# Patient Record
Sex: Female | Born: 1985 | Race: Black or African American | Hispanic: No | Marital: Single | Smoking: Never smoker
Health system: Southern US, Community
[De-identification: ages and names within clinical notes are randomized; demographics above are authoritative.]

## PROBLEM LIST (undated history)

## (undated) ENCOUNTER — Inpatient Hospital Stay (HOSPITAL_COMMUNITY): Payer: Self-pay

## (undated) DIAGNOSIS — O139 Gestational [pregnancy-induced] hypertension without significant proteinuria, unspecified trimester: Secondary | ICD-10-CM

## (undated) DIAGNOSIS — B999 Unspecified infectious disease: Secondary | ICD-10-CM

---
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
## 2005-06-03 DIAGNOSIS — O139 Gestational [pregnancy-induced] hypertension without significant proteinuria, unspecified trimester: Secondary | ICD-10-CM

## 2005-06-03 HISTORY — DX: Gestational (pregnancy-induced) hypertension without significant proteinuria, unspecified trimester: O13.9

## 2008-09-13 ENCOUNTER — Emergency Department (HOSPITAL_COMMUNITY): Admission: EM | Admit: 2008-09-13 | Discharge: 2008-09-13 | Payer: Self-pay | Admitting: Family Medicine

## 2012-03-18 ENCOUNTER — Encounter (HOSPITAL_COMMUNITY): Payer: Self-pay | Admitting: Radiology

## 2012-03-18 ENCOUNTER — Inpatient Hospital Stay (HOSPITAL_COMMUNITY): Payer: Medicaid Other

## 2012-03-18 ENCOUNTER — Inpatient Hospital Stay (HOSPITAL_COMMUNITY)
Admission: AD | Admit: 2012-03-18 | Discharge: 2012-03-18 | Disposition: A | Discharge status: Home / Self Care | Payer: Medicaid Other | Source: Ambulatory Visit | Attending: Obstetrics & Gynecology | Admitting: Obstetrics & Gynecology

## 2012-03-18 DIAGNOSIS — R109 Unspecified abdominal pain: Secondary | ICD-10-CM | POA: Insufficient documentation

## 2012-03-18 DIAGNOSIS — O99891 Other specified diseases and conditions complicating pregnancy: Secondary | ICD-10-CM | POA: Insufficient documentation

## 2012-03-18 HISTORY — DX: Unspecified infectious disease: B99.9

## 2012-03-18 HISTORY — DX: Gestational (pregnancy-induced) hypertension without significant proteinuria, unspecified trimester: O13.9

## 2012-03-18 LAB — WET PREP, GENITAL: Clue Cells Wet Prep HPF POC: NONE SEEN

## 2012-03-18 LAB — URINALYSIS, ROUTINE W REFLEX MICROSCOPIC
Ketones, ur: 15 mg/dL — AB
Leukocytes, UA: NEGATIVE
Nitrite: NEGATIVE
Protein, ur: NEGATIVE mg/dL
Urobilinogen, UA: 2 mg/dL — ABNORMAL HIGH (ref 0.0–1.0)
pH: 6.5 (ref 5.0–8.0)

## 2012-03-18 LAB — CBC
MCH: 29.1 pg (ref 26.0–34.0)
MCHC: 35.8 g/dL (ref 30.0–36.0)
MCV: 81.4 fL (ref 78.0–100.0)
Platelets: 307 10*3/uL (ref 150–400)

## 2012-03-18 LAB — ABO/RH: ABO/RH(D): A POS

## 2012-03-18 NOTE — MAU Provider Note (Signed)
History     CSN: [id number]  Arrival date and time: 03/18/12 1215   First Provider Initiated Contact with Patient 03/18/12 1523      Chief Complaint  Patient presents with  . Possible Pregnancy  . Abdominal Cramping   HPI Paula Livingston is 26 y.o. [id number] Unknown weeks presenting with cramping that began 2 weeks ago.  LMP ? Hx of irregular cycles.  Sexually active X 1 partner.  Using nothing for contraception. She thought with her cycles being irregular and the fact that she has not used contraception in 5 years with conceiving that she couldn't get pregnant.   Denies vaginal bleeding, nausea and vomiting.  Not sure where she will get prenatal care.  Just now learning of pregnancy.     Past Medical History  Diagnosis Date  . Pregnancy induced hypertension   . Infection     gonorrhea    Past Surgical History  Procedure Date  . Cesarean section     Family History  Problem Relation Age of Onset  . Other Neg Hx     History  Substance Use Topics  . Smoking status: Never Smoker   . Smokeless tobacco: Never Used  . Alcohol Use: No    Allergies: No Known Allergies  Prescriptions prior to admission  Medication Sig Dispense Refill  . ibuprofen (ADVIL,MOTRIN) 200 MG tablet Take 400 mg by mouth every 6 (six) hours as needed. For pain        Review of Systems  Constitutional: Negative.   HENT: Negative.   Respiratory: Negative.   Cardiovascular: Negative.   Gastrointestinal: Positive for abdominal pain (cramping-comes and goes). Negative for nausea and vomiting.  Genitourinary:       Neg for discharge or bleeding   Physical Exam   Blood pressure 113/66, pulse 76, temperature 98.9 F (37.2 C), temperature source Oral, resp. rate 18, height 5\' 1"  (1.549 m), weight 75.479 kg (166 lb 6.4 oz), SpO2 100.00%.  Physical Exam  Constitutional: She is oriented to person, place, and time. She appears well-developed and well-nourished. No distress.  HENT:  Head:  Normocephalic.  Neck: Normal range of motion.  Cardiovascular: Normal rate.   Respiratory: Effort normal.  GI: Soft. She exhibits no distension and no mass. There is no tenderness. There is no rebound and no guarding.  Genitourinary: There is no tenderness on the right labia. There is no tenderness on the left labia. Uterus is not enlarged and not tender. Right adnexum displays no mass, no tenderness and no fullness. Left adnexum displays no mass, no tenderness and no fullness. No erythema or bleeding around the vagina. Vaginal discharge (small amount of white discharge without odor) found.  Neurological: She is alert and oriented to person, place, and time.  Skin: Skin is warm and dry.  Psychiatric: She has a normal mood and affect. Her behavior is normal.    Results for orders placed during the hospital encounter of 03/18/12 (from the past 24 hour(s))  URINALYSIS, ROUTINE W REFLEX MICROSCOPIC     Status: Abnormal   Collection Time   03/18/12 12:35 PM      Component Value Range   Color, Urine YELLOW  YELLOW   APPearance CLEAR  CLEAR   Specific Gravity, Urine 1.020  1.005 - 1.030   pH 6.5  5.0 - 8.0   Glucose, UA NEGATIVE  NEGATIVE mg/dL   Hgb urine dipstick NEGATIVE  NEGATIVE   Bilirubin Urine NEGATIVE  NEGATIVE   Ketones, ur 15 (*) NEGATIVE  mg/dL   Protein, ur NEGATIVE  NEGATIVE mg/dL   Urobilinogen, UA 2.0 (*) 0.0 - 1.0 mg/dL   Nitrite NEGATIVE  NEGATIVE   Leukocytes, UA NEGATIVE  NEGATIVE  POCT PREGNANCY, URINE     Status: Abnormal   Collection Time   03/18/12 12:43 PM      Component Value Range   Preg Test, Ur POSITIVE (*) NEGATIVE  CBC     Status: Normal   Collection Time   03/18/12  2:10 PM      Component Value Range   WBC 8.7  4.0 - 10.5 K/uL   RBC 4.57  3.87 - 5.11 MIL/uL   Hemoglobin 13.3  12.0 - 15.0 g/dL   HCT 40.9  81.1 - 91.4 %   MCV 81.4  78.0 - 100.0 fL   MCH 29.1  26.0 - 34.0 pg   MCHC 35.8  30.0 - 36.0 g/dL   RDW 78.2  95.6 - 21.3 %   Platelets 307  150  - 400 K/uL  ABO/RH     Status: Normal (Preliminary result)   Collection Time   03/18/12  2:10 PM      Component Value Range   ABO/RH(D) A POS    HCG, QUANTITATIVE, PREGNANCY     Status: Abnormal   Collection Time   03/18/12  2:10 PM      Component Value Range   hCG, Beta Chain, Quant, S 2764 (*) <5 mIU/mL  WET PREP, GENITAL     Status: Abnormal   Collection Time   03/18/12  3:50 PM      Component Value Range   Yeast Wet Prep HPF POC NONE SEEN  NONE SEEN   Trich, Wet Prep NONE SEEN  NONE SEEN   Clue Cells Wet Prep HPF POC NONE SEEN  NONE SEEN   WBC, Wet Prep HPF POC FEW (*) NONE SEEN    MAU Course  Procedures  GC/CHL culture to lab  MDM  Assessment and Plan  A;  Very early IUGS measuring [redacted]w[redacted]d.     Cramping in early pregnancy       P  Repeat BHCG 48 hrs      Return for increasing pain or heavy vaginal bleeding.  Paula Livingston,EVE M 03/18/2012, 4:09 PM

## 2012-03-18 NOTE — MAU Note (Signed)
Patient state she has taken 2 home pregnancy tests earlier this week, one positive, one negative. Has been having abdominal cramping for about 2 weeks. Denies bleeding or discharge.

## 2012-03-18 NOTE — MAU Note (Signed)
Lower abd cramping started a couple wks ago. Pain comes and goes.

## 2012-03-20 ENCOUNTER — Encounter (HOSPITAL_COMMUNITY): Payer: Self-pay | Admitting: *Deleted

## 2012-03-20 ENCOUNTER — Inpatient Hospital Stay (HOSPITAL_COMMUNITY)
Admission: AD | Admit: 2012-03-20 | Discharge: 2012-03-20 | Disposition: A | Discharge status: Home / Self Care | Payer: Medicaid Other | Source: Ambulatory Visit | Attending: Obstetrics & Gynecology | Admitting: Obstetrics & Gynecology

## 2012-03-20 DIAGNOSIS — O3680X0 Pregnancy with inconclusive fetal viability, not applicable or unspecified: Secondary | ICD-10-CM

## 2012-03-20 DIAGNOSIS — R109 Unspecified abdominal pain: Secondary | ICD-10-CM | POA: Insufficient documentation

## 2012-03-20 DIAGNOSIS — O26899 Other specified pregnancy related conditions, unspecified trimester: Secondary | ICD-10-CM

## 2012-03-20 DIAGNOSIS — O99891 Other specified diseases and conditions complicating pregnancy: Secondary | ICD-10-CM | POA: Insufficient documentation

## 2012-03-20 LAB — HCG, QUANTITATIVE, PREGNANCY: hCG, Beta Chain, Quant, S: 4221 m[IU]/mL — ABNORMAL HIGH (ref <5)

## 2012-03-20 NOTE — MAU Provider Note (Signed)
Attestation of Attending Supervision of Advanced Practitioner (CNM/NP): Evaluation and management procedures were performed by the Advanced Practitioner under my supervision and collaboration.  I have reviewed the Advanced Practitioner's note and chart, and I agree with the management and plan.  HARRAWAY-SMITH, Shemar Plemmons 12:51 PM     

## 2012-03-20 NOTE — MAU Provider Note (Signed)
  History     CSN: [id number]  Arrival date and time: 03/20/12 1454   None     No chief complaint on file.  HPI Pt is [redacted]w[redacted]d pregnant here for follow up BetaHCG.  Pt was initially seen on 10/16 with cramping.  Her HCG at that time was 2764 with ultrasound showing a gestational sac measuring [redacted]w[redacted]d but no YS. Pt had some cramping earlier, but none now and no bleeding.  Past Medical History  Diagnosis Date  . Pregnancy induced hypertension   . Infection     gonorrhea    Past Surgical History  Procedure Date  . Cesarean section     Family History  Problem Relation Age of Onset  . Other Neg Hx     History  Substance Use Topics  . Smoking status: Never Smoker   . Smokeless tobacco: Never Used  . Alcohol Use: No    Allergies: No Known Allergies  No prescriptions prior to admission    ROS Physical Exam   There were no vitals taken for this visit.  Physical Exam  MAU Course  Procedures Results for orders placed during the hospital encounter of 03/20/12 (from the past 72 hour(s))  HCG, QUANTITATIVE, PREGNANCY     Status: Abnormal   Collection Time   03/20/12  3:00 PM      Component Value Range Comment   hCG, Beta Chain, Quant, S 4221 (*) <5 mIU/mL    Discussed with Dr. Erin Fulling- will have pt return for ultrasound one week from prior ultrasound- order placed in Epic for radiology to schedule appointment and contact pt. Assessment and Plan  Abdominal pain in early pregnancy Repeat ultrasound 10/23 to confirm viability  Deirdre Gryder 03/20/2012, 3:03 PM

## 2012-03-20 NOTE — MAU Note (Signed)
Back today as instructed for repeat blood work.  Had some cramping earlier today, none now.  No bleeding.  No complaints, doing ok.

## 2012-03-23 NOTE — MAU Provider Note (Signed)
Attestation of Attending Supervision of Advanced Practitioner (CNM/NP): Evaluation and management procedures were performed by the Advanced Practitioner under my supervision and collaboration.  I have reviewed the Advanced Practitioner's note and chart, and I agree with the management and plan.  HARRAWAY-SMITH, Teyana Pierron 2:00 PM     

## 2012-03-25 ENCOUNTER — Encounter (HOSPITAL_COMMUNITY): Payer: Self-pay

## 2012-03-25 ENCOUNTER — Ambulatory Visit (HOSPITAL_COMMUNITY)
Admission: RE | Admit: 2012-03-25 | Discharge: 2012-03-25 | Disposition: A | Discharge status: Home / Self Care | Payer: Medicaid Other | Source: Ambulatory Visit | Attending: Gynecology | Admitting: Gynecology

## 2012-03-25 ENCOUNTER — Other Ambulatory Visit (HOSPITAL_COMMUNITY): Payer: Self-pay | Admitting: Gynecology

## 2012-03-25 ENCOUNTER — Inpatient Hospital Stay (HOSPITAL_COMMUNITY)
Admission: AD | Admit: 2012-03-25 | Discharge: 2012-03-25 | Disposition: A | Discharge status: Home / Self Care | Payer: Self-pay | Source: Ambulatory Visit | Attending: Obstetrics & Gynecology | Admitting: Obstetrics & Gynecology

## 2012-03-25 DIAGNOSIS — R109 Unspecified abdominal pain: Secondary | ICD-10-CM

## 2012-03-25 DIAGNOSIS — Z1389 Encounter for screening for other disorder: Secondary | ICD-10-CM

## 2012-03-25 DIAGNOSIS — O99891 Other specified diseases and conditions complicating pregnancy: Secondary | ICD-10-CM | POA: Insufficient documentation

## 2012-03-25 DIAGNOSIS — O3680X0 Pregnancy with inconclusive fetal viability, not applicable or unspecified: Secondary | ICD-10-CM | POA: Insufficient documentation

## 2012-03-25 DIAGNOSIS — O34219 Maternal care for unspecified type scar from previous cesarean delivery: Secondary | ICD-10-CM | POA: Insufficient documentation

## 2012-03-25 DIAGNOSIS — O26899 Other specified pregnancy related conditions, unspecified trimester: Secondary | ICD-10-CM

## 2012-03-25 DIAGNOSIS — Z3490 Encounter for supervision of normal pregnancy, unspecified, unspecified trimester: Secondary | ICD-10-CM

## 2012-03-25 NOTE — MAU Note (Signed)
Pt states here for f/u u/s for viability, denies pain or bleeding.

## 2012-03-25 NOTE — MAU Provider Note (Signed)
  History     CSN: [id number]  Arrival date and time: 03/25/12 1051   First Provider Initiated Contact with Patient 03/25/12 1126      Chief Complaint  Patient presents with  . Follow-up u/s for viability    HPI Paula Livingston 26 y.o. [redacted]w[redacted]d  Here today for follow up ultrasound.  No pain.  No bleeding.  OB History    Grav Para Term Preterm Abortions TAB SAB Ect Mult Living   3 2 1 1  0 0 0 0 0 2      Past Medical History  Diagnosis Date  . Pregnancy induced hypertension   . Infection     gonorrhea    Past Surgical History  Procedure Date  . Cesarean section     Family History  Problem Relation Age of Onset  . Other Neg Hx     History  Substance Use Topics  . Smoking status: Never Smoker   . Smokeless tobacco: Never Used  . Alcohol Use: No    Allergies: No Known Allergies  No prescriptions prior to admission    Review of Systems  Gastrointestinal: Negative for abdominal pain.  Genitourinary:       No vaginal discharge. No vaginal bleeding. No dysuria.   Physical Exam   Blood pressure 121/61, pulse 86, temperature 97.9 F (36.6 C), temperature source Oral, resp. rate 16, height 5\' 1"  (1.549 m), weight 75.751 kg (167 lb).  Physical Exam  Nursing note and vitals reviewed. Constitutional: She is oriented to person, place, and time. She appears well-developed and well-nourished.  HENT:  Head: Normocephalic.  Eyes: EOM are normal.  Neck: Neck supple.  Musculoskeletal: Normal range of motion.  Neurological: She is alert and oriented to person, place, and time.  Skin: Skin is warm and dry.  Psychiatric: She has a normal mood and affect.    MAU Course  Procedures  MDM Ultrasound results - [redacted]w[redacted]d viable IUP  Assessment and Plan  Viable IUP  Plan Begin prenatal care ASAP List of providers given Verification of pregnancy given.  Jaisen Wiltrout 03/25/2012, 11:44 AM

## 2012-04-03 NOTE — MAU Provider Note (Signed)
Medical Screening exam and patient care preformed by advanced practice provider.  Agree with the above management.  

## 2012-06-03 NOTE — L&D Delivery Note (Signed)
Delivery Note  RN called me to bedside at 1711 for urge to push, SROM w clear fluid at 1715, epidural had just been completed and pt was beginning to get some relief, Dr Rodman Pickle remained at [facility], vag exam cervix complete and vtx +3, FHR overall reassuring but variable decels noted, prepped for delivery and pt pushed well over 1 ctx,    At 5:18 PM a viable female was delivered via VBAC, Spontaneous (Presentation: ; Occiput Anterior).  Shoulders delivered easily, cord tight around R ankle, APGAR: 9, 9; weight: pending  Placenta status: spontaneous, intact, sent to pathology .  Cord: 3 vessels with the following complications: None.  Cord pH: n/a  Anesthesia: Epidural, local  Episiotomy: None Lacerations: Periurethral and L labial, repaired for hemostasis, red robinson placed for visualization, UA sent to check protein Suture Repair: 4-0 monocryl Est. Blood Loss (mL): 300  Mild uterine atony noted, improved w bimanual massage, cytotec given rectally BP's had improved, Platelets 171, rest of PIH labs pending C/w Dr Normand Sloop, will place foley and collect 24hr urine, repeat PIH labs in the AM CTO for s/s pre-eclampsia    Mom to postpartum.  Baby to nursery-stable. Infant remains skin-skin w pt Mother and infant stable in recovery room Routine PP orders RN to place foley and begin 24hr urine collection to be sent stat for protein Will repeat CBC, CMET, LDH, uric acid in the AM   Rameses Ou M 10/27/2012, 6:18 PM

## 2012-06-05 ENCOUNTER — Ambulatory Visit (INDEPENDENT_AMBULATORY_CARE_PROVIDER_SITE_OTHER): Payer: Medicaid Other | Admitting: Obstetrics and Gynecology

## 2012-06-05 DIAGNOSIS — Z331 Pregnant state, incidental: Secondary | ICD-10-CM

## 2012-06-05 LAB — POCT URINALYSIS DIPSTICK
Blood, UA: NEGATIVE
Ketones, UA: NEGATIVE
Spec Grav, UA: 1.02
Urobilinogen, UA: NEGATIVE
pH, UA: 6

## 2012-06-05 NOTE — Progress Notes (Signed)
NOB interview. Records requested from [facility] for previous C/S. PT will consider QUAD screen and decide by NV.

## 2012-06-07 LAB — CULTURE, OB URINE: Colony Count: 9000

## 2012-06-08 LAB — PRENATAL PANEL VII
Basophils Relative: 0 % (ref 0–1)
Eosinophils Absolute: 0.1 10*3/uL (ref 0.0–0.7)
HIV: NONREACTIVE
Hemoglobin: 12.7 g/dL (ref 12.0–15.0)
Hepatitis B Surface Ag: NEGATIVE
Lymphs Abs: 2.1 10*3/uL (ref 0.7–4.0)
MCHC: 35.7 g/dL (ref 30.0–36.0)
Monocytes Relative: 8 % (ref 3–12)
Neutro Abs: 4.4 10*3/uL (ref 1.7–7.7)
Neutrophils Relative %: 62 % (ref 43–77)
Platelets: 244 10*3/uL (ref 150–400)
RBC: 4.21 MIL/uL (ref 3.87–5.11)

## 2012-06-09 LAB — HEMOGLOBINOPATHY EVALUATION
Hemoglobin Other: 0 %
Hgb A2 Quant: 2.6 % (ref 2.2–3.2)
Hgb A: 97.4 % (ref 96.8–97.8)
Hgb S Quant: 0 %

## 2012-06-16 ENCOUNTER — Ambulatory Visit: Payer: Medicaid Other | Admitting: *Deleted

## 2012-06-16 ENCOUNTER — Ambulatory Visit: Payer: Medicaid Other

## 2012-06-16 VITALS — BP 100/56 | Wt 164.0 lb

## 2012-06-16 DIAGNOSIS — Z331 Pregnant state, incidental: Secondary | ICD-10-CM

## 2012-06-16 DIAGNOSIS — O09299 Supervision of pregnancy with other poor reproductive or obstetric history, unspecified trimester: Secondary | ICD-10-CM | POA: Insufficient documentation

## 2012-06-16 DIAGNOSIS — O34219 Maternal care for unspecified type scar from previous cesarean delivery: Secondary | ICD-10-CM | POA: Insufficient documentation

## 2012-06-16 LAB — POCT URINALYSIS DIPSTICK
Bilirubin, UA: NEGATIVE
Blood, UA: NEGATIVE
Spec Grav, UA: 1.015
pH, UA: 7

## 2012-06-16 LAB — US OB COMP + 14 WK

## 2012-06-16 NOTE — Progress Notes (Unsigned)
Subjective:    Paula Livingston is being seen today for her first obstetrical visit at [redacted]w[redacted]d gestation by [place]. She is 27 y.o. African American, relationship w FOB Married, works full-time temp at [employer]. FOB is in the [place] away from the home presently. Discussed maternity care coordination. Desires call from Care Coordinator.  She denies any vag bleeding, cramping, or discharge.  She denies nausea/vomiting.   Her obstetrical history is significant for: Patient Active Problem List  Diagnosis  . Previous cesarean delivery, delivered, with or without mention of antepartum condition  . Hx of pre-eclampsia in prior pregnancy, currently pregnant  . H/O preterm delivery, currently pregnant  . Desires VBAC (vaginal birth after cesarean) trial      Pregnancy history fully reviewed.  The following portions of the patient's history were reviewed and updated as appropriate: allergies, current medications, past family history, past medical history, past social history, past surgical history and problem list.  Review of Systems Pertinent ROS is described in HPI   Objective:   BP 100/56  Wt 164 lb (74.39 kg) Wt Readings from Last 1 Encounters:  06/16/12 164 lb (74.39 kg)   BMI: 29.3  General: alert, cooperative and no distress HEENT: grossly normal  Thyroid: normal  Respiratory: clear to auscultation bilaterally Cardiovascular: regular rate and rhythm,  Breasts:  No dominant masses, nipples erect Gastrointestinal: soft, non-tender; no masses,  no organomegaly Extremities: extremities normal, no pain or edema   EXTERNAL GENITALIA: normal appearing vulva with no masses, tenderness or lesions VAGINA: no abnormal discharge, bleeding or lesions CERVIX: no lesions or cervical motion tenderness; cervix closed, long, firm UTERUS: gravid and consistent with 17 weeks ADNEXA: deferred until next visit - wnl per [place] today OB EXAM PELVIMETRY: deferred until next visit  FHR:  155   bpm    Assessment:    Pregnancy at  17 weeks with history of preterm delivery @ 32 weeks for pre-ecalmpsia and previous c/s (G2) for fetal intolerance to labor. Desires VBAC Plan:    Quad screen Anatomy scan Pelvic and cultures at next visit.  Prenatal labs rv'd  Urine cx wnl - per [place]  Blood type A pos Discussion of Genetic testing options: Quad screen done today Problem list reviewed and updated. rv'd how and when to call for emergencies rv'd practice routines Discussed nutrition and exercise and common pregnancy discomforts   F/u In 4 weeks with midwife and f/up US for heart views   Windle Guard, RN   [place] 17 wks female Mason Jim Breech Posterior placenta 2.8cm from Os Vertical fluid pocket = 4.3cm - wnl All anatomy except heart is seen and is wnl Bilat adnexa wnl. No free fluid is seen.  RTC in four weeks for [place] f/up heart.

## 2012-06-16 NOTE — Progress Notes (Unsigned)
Pt is here today for her NOB work-up. Pt unsure of pap peer pt wnl. Cultures done at MAU 03/18/2012 wnl . Pt stated no issues today .

## 2012-06-17 LAB — AFP, QUAD SCREEN
AFP: 42.4 IU/mL
Age Alone: 1:958 {titer}
HCG, Total: 22225 m[IU]/mL
MoM for AFP: 1.2
MoM for hCG: 1.1
Open Spina bifida: NEGATIVE
Tri 18 Scr Risk Est: NEGATIVE
uE3 Value: 0.6 ng/mL

## 2012-07-15 ENCOUNTER — Other Ambulatory Visit: Payer: Self-pay

## 2012-07-15 DIAGNOSIS — Z3689 Encounter for other specified antenatal screening: Secondary | ICD-10-CM

## 2012-07-17 ENCOUNTER — Ambulatory Visit: Payer: Medicaid Other

## 2012-07-17 ENCOUNTER — Encounter: Payer: Self-pay | Admitting: Obstetrics and Gynecology

## 2012-07-17 ENCOUNTER — Ambulatory Visit: Payer: Medicaid Other | Admitting: Obstetrics and Gynecology

## 2012-07-17 VITALS — BP 100/66 | Wt 168.0 lb

## 2012-07-17 DIAGNOSIS — Z3689 Encounter for other specified antenatal screening: Secondary | ICD-10-CM

## 2012-07-17 DIAGNOSIS — Z3490 Encounter for supervision of normal pregnancy, unspecified, unspecified trimester: Secondary | ICD-10-CM

## 2012-07-17 LAB — US OB FOLLOW UP

## 2012-07-17 NOTE — Progress Notes (Signed)
[redacted]w[redacted]d [place] today to complete anatomy--all WNL, cervix 3.75, normal fluid and growth. Reviewed NOB labs, Hgb electrophoresis, and Quad screen--all WNL Glucola NV. Desires VBAC--R&B reviewed. Needs to sign VBAC consent at NV.

## 2012-07-17 NOTE — Progress Notes (Signed)
[redacted]w[redacted]d No complaints per pt  Ultrasound shows:  [place] EDD: 11/17/2012           AFI: normal fluid (vertical pocket = 4.3 cm)           Cervical length: 3.75 cm           Placenta localization: posterior            Fetal presentation: breech                   Anatomy survey is normal           Gender : female

## 2012-08-13 ENCOUNTER — Encounter: Payer: Medicaid Other | Admitting: Obstetrics and Gynecology

## 2012-08-18 ENCOUNTER — Encounter: Payer: Self-pay | Admitting: Obstetrics and Gynecology

## 2012-08-18 ENCOUNTER — Ambulatory Visit: Payer: Medicaid Other | Admitting: Obstetrics and Gynecology

## 2012-08-18 ENCOUNTER — Other Ambulatory Visit: Payer: Medicaid Other

## 2012-08-18 VITALS — BP 114/60 | Wt 172.0 lb

## 2012-08-18 DIAGNOSIS — O34219 Maternal care for unspecified type scar from previous cesarean delivery: Secondary | ICD-10-CM

## 2012-08-18 NOTE — Progress Notes (Signed)
[redacted]w[redacted]d Pt w/o complaint, will come back tomorrow morning for lab only visit for glucola.

## 2012-08-19 ENCOUNTER — Other Ambulatory Visit: Payer: Self-pay | Admitting: Obstetrics and Gynecology

## 2012-08-20 LAB — HEMOGLOBIN: Hemoglobin: 11.5 g/dL — ABNORMAL LOW (ref 12.0–15.0)

## 2012-08-20 LAB — GLUCOSE TOLERANCE, 1 HOUR (50G) W/O FASTING: Glucose, 1 Hour GTT: 79 mg/dL (ref 70–140)

## 2012-10-15 LAB — OB RESULTS CONSOLE GC/CHLAMYDIA: Gonorrhea: NEGATIVE

## 2012-10-15 LAB — OB RESULTS CONSOLE GBS: GBS: NEGATIVE

## 2012-10-27 ENCOUNTER — Encounter (HOSPITAL_COMMUNITY): Payer: Self-pay | Admitting: Anesthesiology

## 2012-10-27 ENCOUNTER — Inpatient Hospital Stay (HOSPITAL_COMMUNITY)
Admission: AD | Admit: 2012-10-27 | Discharge: 2012-10-29 | DRG: 775 | Disposition: A | Discharge status: Home / Self Care | Payer: Medicaid Other | Source: Ambulatory Visit | Attending: Obstetrics and Gynecology | Admitting: Obstetrics and Gynecology

## 2012-10-27 ENCOUNTER — Encounter (HOSPITAL_COMMUNITY): Payer: Self-pay | Admitting: *Deleted

## 2012-10-27 ENCOUNTER — Inpatient Hospital Stay (HOSPITAL_COMMUNITY): Payer: Medicaid Other | Admitting: Anesthesiology

## 2012-10-27 DIAGNOSIS — O09299 Supervision of pregnancy with other poor reproductive or obstetric history, unspecified trimester: Secondary | ICD-10-CM

## 2012-10-27 DIAGNOSIS — Z98891 History of uterine scar from previous surgery: Secondary | ICD-10-CM | POA: Diagnosis not present

## 2012-10-27 DIAGNOSIS — O34219 Maternal care for unspecified type scar from previous cesarean delivery: Secondary | ICD-10-CM | POA: Diagnosis present

## 2012-10-27 LAB — CBC
Platelets: 172 10*3/uL (ref 150–400)
RBC: 4.42 MIL/uL (ref 3.87–5.11)
WBC: 8.8 10*3/uL (ref 4.0–10.5)

## 2012-10-27 LAB — COMPREHENSIVE METABOLIC PANEL
BUN: 8 mg/dL (ref 6–23)
CO2: 21 mEq/L (ref 19–32)
Chloride: 99 mEq/L (ref 96–112)
Creatinine, Ser: 0.5 mg/dL (ref 0.50–1.10)
GFR calc non Af Amer: >90 mL/min (ref >90)
Glucose, Bld: 80 mg/dL (ref 70–99)
Total Bilirubin: 0.4 mg/dL (ref 0.3–1.2)

## 2012-10-27 LAB — URINALYSIS, ROUTINE W REFLEX MICROSCOPIC
Protein, ur: NEGATIVE mg/dL
Specific Gravity, Urine: 1.02 (ref 1.005–1.030)
Urobilinogen, UA: 1 mg/dL (ref 0.0–1.0)

## 2012-10-27 LAB — URINE MICROSCOPIC-ADD ON

## 2012-10-27 LAB — URIC ACID: Uric Acid, Serum: 5.1 mg/dL (ref 2.4–7.0)

## 2012-10-27 LAB — LACTATE DEHYDROGENASE: LDH: 173 U/L (ref 94–250)

## 2012-10-27 LAB — RPR: RPR Ser Ql: NONREACTIVE

## 2012-10-27 MED ORDER — FLEET ENEMA 7-19 GM/118ML RE ENEM: 1.0000 | ENEMA | Freq: Every day | RECTAL | Status: DC | PRN

## 2012-10-27 MED ORDER — IBUPROFEN 600 MG PO TABS
600.0000 mg | ORAL_TABLET | Freq: Four times a day (QID) | ORAL | Status: DC
  Administered 2012-10-27 – 2012-10-29 (×7): 600 mg via ORAL
  Filled 2012-10-27 (×3): qty 1

## 2012-10-27 MED ORDER — MISOPROSTOL 200 MCG PO TABS
ORAL_TABLET | ORAL | Status: AC
  Administered 2012-10-27: 1000 ug via RECTAL
  Filled 2012-10-27: qty 5

## 2012-10-27 MED ORDER — DIPHENHYDRAMINE HCL 50 MG/ML IJ SOLN: 12.5000 mg | INTRAMUSCULAR | Status: DC | PRN

## 2012-10-27 MED ORDER — LIDOCAINE HCL (PF) 1 % IJ SOLN
30.0000 mL | INTRAMUSCULAR | Status: AC | PRN
  Administered 2012-10-27: 30 mL via SUBCUTANEOUS
  Filled 2012-10-27 (×2): qty 30

## 2012-10-27 MED ORDER — ONDANSETRON HCL 4 MG PO TABS: 4.0000 mg | ORAL_TABLET | ORAL | Status: DC | PRN

## 2012-10-27 MED ORDER — MEDROXYPROGESTERONE ACETATE 150 MG/ML IM SUSP: 150.0000 mg | INTRAMUSCULAR | Status: DC | PRN

## 2012-10-27 MED ORDER — LIDOCAINE HCL (PF) 1 % IJ SOLN
INTRAMUSCULAR | Status: DC | PRN
  Administered 2012-10-27: 4 mL
  Administered 2012-10-27: 2 mL
  Administered 2012-10-27 (×2): 4 mL

## 2012-10-27 MED ORDER — MEASLES, MUMPS & RUBELLA VAC ~~LOC~~ INJ: 0.5000 mL | INJECTION | Freq: Once | SUBCUTANEOUS | Status: DC

## 2012-10-27 MED ORDER — ZOLPIDEM TARTRATE 5 MG PO TABS: 5.0000 mg | ORAL_TABLET | Freq: Every evening | ORAL | Status: DC | PRN

## 2012-10-27 MED ORDER — MISOPROSTOL 200 MCG PO TABS: 1000.0000 ug | ORAL_TABLET | Freq: Once | ORAL | Status: AC

## 2012-10-27 MED ORDER — LACTATED RINGERS IV SOLN
INTRAVENOUS | Status: DC
  Administered 2012-10-27: 16:00:00 via INTRAVENOUS

## 2012-10-27 MED ORDER — FENTANYL 2.5 MCG/ML BUPIVACAINE 1/10 % EPIDURAL INFUSION (WH - ANES)
14.0000 mL/h | INTRAMUSCULAR | Status: DC | PRN
  Administered 2012-10-27: 12 mL/h via EPIDURAL
  Filled 2012-10-27: qty 125

## 2012-10-27 MED ORDER — CITRIC ACID-SODIUM CITRATE 334-500 MG/5ML PO SOLN: 30.0000 mL | ORAL | Status: DC | PRN

## 2012-10-27 MED ORDER — TETANUS-DIPHTH-ACELL PERTUSSIS 5-2.5-18.5 LF-MCG/0.5 IM SUSP
0.5000 mL | Freq: Once | INTRAMUSCULAR | Status: AC
Start: 2012-10-28 — End: 2012-10-28
  Administered 2012-10-28: 0.5 mL via INTRAMUSCULAR
  Filled 2012-10-27: qty 0.5

## 2012-10-27 MED ORDER — LACTATED RINGERS IV SOLN: 500.0000 mL | INTRAVENOUS | Status: DC | PRN

## 2012-10-27 MED ORDER — IBUPROFEN 600 MG PO TABS
600.0000 mg | ORAL_TABLET | Freq: Four times a day (QID) | ORAL | Status: DC | PRN
  Filled 2012-10-27 (×4): qty 1

## 2012-10-27 MED ORDER — ACETAMINOPHEN 325 MG PO TABS: 650.0000 mg | ORAL_TABLET | ORAL | Status: DC | PRN

## 2012-10-27 MED ORDER — PRENATAL MULTIVITAMIN CH
1.0000 | ORAL_TABLET | Freq: Every day | ORAL | Status: DC
  Administered 2012-10-28 – 2012-10-29 (×2): 1 via ORAL
  Filled 2012-10-27 (×2): qty 1

## 2012-10-27 MED ORDER — LANOLIN HYDROUS EX OINT: TOPICAL_OINTMENT | CUTANEOUS | Status: DC | PRN

## 2012-10-27 MED ORDER — FENTANYL CITRATE 0.05 MG/ML IJ SOLN
100.0000 ug | INTRAMUSCULAR | Status: DC | PRN
  Administered 2012-10-27: 100 ug via INTRAVENOUS
  Filled 2012-10-27: qty 2

## 2012-10-27 MED ORDER — DIBUCAINE 1 % RE OINT: 1.0000 "application " | TOPICAL_OINTMENT | RECTAL | Status: DC | PRN

## 2012-10-27 MED ORDER — WITCH HAZEL-GLYCERIN EX PADS: 1.0000 "application " | MEDICATED_PAD | CUTANEOUS | Status: DC | PRN

## 2012-10-27 MED ORDER — EPHEDRINE 5 MG/ML INJ
10.0000 mg | INTRAVENOUS | Status: DC | PRN
  Filled 2012-10-27: qty 2
  Filled 2012-10-27: qty 4

## 2012-10-27 MED ORDER — SIMETHICONE 80 MG PO CHEW: 80.0000 mg | CHEWABLE_TABLET | ORAL | Status: DC | PRN

## 2012-10-27 MED ORDER — PHENYLEPHRINE 40 MCG/ML (10ML) SYRINGE FOR IV PUSH (FOR BLOOD PRESSURE SUPPORT)
80.0000 ug | PREFILLED_SYRINGE | INTRAVENOUS | Status: DC | PRN
  Filled 2012-10-27: qty 2

## 2012-10-27 MED ORDER — PHENYLEPHRINE 40 MCG/ML (10ML) SYRINGE FOR IV PUSH (FOR BLOOD PRESSURE SUPPORT)
80.0000 ug | PREFILLED_SYRINGE | INTRAVENOUS | Status: DC | PRN
  Filled 2012-10-27: qty 2
  Filled 2012-10-27: qty 5

## 2012-10-27 MED ORDER — OXYCODONE-ACETAMINOPHEN 5-325 MG PO TABS: 1.0000 | ORAL_TABLET | ORAL | Status: DC | PRN

## 2012-10-27 MED ORDER — BISACODYL 10 MG RE SUPP: 10.0000 mg | Freq: Every day | RECTAL | Status: DC | PRN

## 2012-10-27 MED ORDER — ONDANSETRON HCL 4 MG/2ML IJ SOLN: 4.0000 mg | INTRAMUSCULAR | Status: DC | PRN

## 2012-10-27 MED ORDER — DIPHENHYDRAMINE HCL 25 MG PO CAPS: 25.0000 mg | ORAL_CAPSULE | Freq: Four times a day (QID) | ORAL | Status: DC | PRN

## 2012-10-27 MED ORDER — EPHEDRINE 5 MG/ML INJ
10.0000 mg | INTRAVENOUS | Status: DC | PRN
  Filled 2012-10-27: qty 2

## 2012-10-27 MED ORDER — BENZOCAINE-MENTHOL 20-0.5 % EX AERO: 1.0000 "application " | INHALATION_SPRAY | CUTANEOUS | Status: DC | PRN

## 2012-10-27 MED ORDER — SENNOSIDES-DOCUSATE SODIUM 8.6-50 MG PO TABS
2.0000 | ORAL_TABLET | Freq: Every day | ORAL | Status: DC
  Administered 2012-10-28: 2 via ORAL

## 2012-10-27 MED ORDER — ONDANSETRON HCL 4 MG/2ML IJ SOLN: 4.0000 mg | Freq: Four times a day (QID) | INTRAMUSCULAR | Status: DC | PRN

## 2012-10-27 MED ORDER — LACTATED RINGERS IV SOLN: 500.0000 mL | Freq: Once | INTRAVENOUS | Status: DC

## 2012-10-27 MED ORDER — OXYTOCIN 40 UNITS IN LACTATED RINGERS INFUSION - SIMPLE MED
62.5000 mL/h | INTRAVENOUS | Status: DC
  Filled 2012-10-27: qty 1000

## 2012-10-27 MED ORDER — OXYTOCIN BOLUS FROM INFUSION
500.0000 mL | INTRAVENOUS | Status: DC
  Administered 2012-10-27: 500 mL via INTRAVENOUS

## 2012-10-27 NOTE — Anesthesia Preprocedure Evaluation (Signed)
Anesthesia Evaluation  Patient identified by MRN, date of birth, ID band Patient awake    Reviewed: Allergy & Precautions, H&P , NPO status , Patient's Chart, lab work & pertinent test results, reviewed documented beta blocker date and time   History of Anesthesia Complications Negative for: history of anesthetic complications  Airway Mallampati: III TM Distance: >3 FB Neck ROM: full    Dental  (+) Teeth Intact   Pulmonary neg pulmonary ROS,  breath sounds clear to auscultation        Cardiovascular negative cardio ROS  Rhythm:regular Rate:Normal     Neuro/Psych negative neurological ROS  negative psych ROS   GI/Hepatic negative GI ROS, Neg liver ROS,   Endo/Other  BMI 36.6  Renal/GU negative Renal ROS     Musculoskeletal   Abdominal   Peds  Hematology negative hematology ROS (+)   Anesthesia Other Findings   Reproductive/Obstetrics (+) Pregnancy (h/o prior c/s x1, desires VBAC)                           Anesthesia Physical Anesthesia Plan  ASA: II  Anesthesia Plan: Epidural   Post-op Pain Management:    Induction:   Airway Management Planned:   Additional Equipment:   Intra-op Plan:   Post-operative Plan:   Informed Consent: I have reviewed the patients History and Physical, chart, labs and discussed the procedure including the risks, benefits and alternatives for the proposed anesthesia with the patient or authorized representative who has indicated his/her understanding and acceptance.     Plan Discussed with:   Anesthesia Plan Comments:         Anesthesia Quick Evaluation

## 2012-10-27 NOTE — MAU Note (Signed)
Presents to MAU via w/c with mother/ FOB, uncomfortable, direct to bed

## 2012-10-27 NOTE — MAU Note (Signed)
Contractions started at 1400.  No bleeding or leaking.  Was 3 when last checked.  Hx of c/s, desires VBAC.

## 2012-10-27 NOTE — H&P (Signed)
Paula Livingston is a 27 y.o. female presenting for labor. Vag exam per RN =7cm. Pt requests epidural. Denies any VB or LOF, reports +FM. Denies any HA/N/V/RUQ pain. Ctx started to be more regular and painful since 2pm today. Desires TOLAC, consent signed 10/12/12.  HPI: Pt began [facility] at 18wks, had several MAU visits prior to this including [place] at 6wks w [facility] of 6/17. Anatomy scan at 18wks WNL, except limited heart views, f/u scan at 22wks WNL Quad screen WNL 1hr gtt normal  GBS and GC/CT neg on 5/15  Maternal Medical History:  Reason for admission: Contractions.   Contractions: Onset was 3-5 hours ago.   Frequency: regular.   Duration is approximately 60 seconds.   Perceived severity is strong.    Fetal activity: Perceived fetal activity is normal.   Last perceived fetal movement was within the past hour.    Prenatal complications: no prenatal complications Prenatal Complications - Diabetes: none.    OB History   Grav Para Term Preterm Abortions TAB SAB Ect Mult Living   3 2 1 1  0 0 0 0 0 2     Obstetric Comments   2007-IOL FOR PREECLAMPSIA 2008 C/S FETAL DISTRESS     G1 10/07 32wks IOL for PIH, forceps, epidural, rcv'd mag sulfate, procardia 1832g  G2 10/08 37wks primary c/s for NRFHR failed vacuum, 2690g  G3 - current   Past Medical History  Diagnosis Date  . Pregnancy induced hypertension 2007  . Preterm labor 2007  . Infection 2005    gonorrhea  . Infection 2005    CHLAMYDIA   Past Surgical History  Procedure Laterality Date  . Cesarean section     Family History: family history includes Alcohol abuse in her brother, maternal uncles, mother, and sister; Aneurysm in her father; Asthma in her son; Birth defects in her son; Cancer in her maternal uncles; Cancer (age of onset: 24) in her maternal aunt; Diabetes in her maternal uncle; Drug abuse in her maternal uncles and mother; and Seizures in her son.  There is no history of Other. Social History:  reports that  she has never smoked. She has never used smokeless tobacco. She reports that she does not drink alcohol or use illicit drugs.   Prenatal Transfer Tool  Maternal Diabetes: No Genetic Screening: Normal Maternal Ultrasounds/Referrals: Normal Fetal Ultrasounds or other Referrals:  None Maternal Substance Abuse:  No Significant Maternal Medications:  None Significant Maternal Lab Results:  Lab values include: Group B Strep negative Other Comments:  None  Review of Systems  All other systems reviewed and are negative.    Dilation: 7 Effacement (%): 100 Exam by:: jolynn Blood pressure 133/91, pulse 92, resp. rate 18, height 4\' 11"  (1.499 m), weight 181 lb (82.101 kg), SpO2 100.00%. Maternal Exam:  Uterine Assessment: Contraction strength is moderate.  Contraction duration is 60 seconds. Contraction frequency is regular.   Abdomen: Patient reports no abdominal tenderness. Surgical scars: low transverse.   Fundal height is aga.   Estimated fetal weight is 6#.   Fetal presentation: vertex  Introitus: Normal vulva. Normal vagina.  Ferning test: not done.   Pelvis: adequate for delivery.   Cervix: Cervix evaluated by digital exam.     Fetal Exam Fetal Monitor Review: Mode: ultrasound.   Baseline rate: 130.  Variability: moderate (6-25 bpm).   Pattern: accelerations present and no decelerations.    Fetal State Assessment: Category I - tracings are normal.     Physical Exam  Nursing note and vitals reviewed. Constitutional: She is oriented to person, place, and time. She appears well-developed and well-nourished. She appears distressed.  Moaning and  Breathing w ctx   HENT:  Head: Normocephalic.  Eyes: Pupils are equal, round, and reactive to light.  Neck: Normal range of motion.  Cardiovascular: Normal rate, regular rhythm and normal heart sounds.   Respiratory: Effort normal and breath sounds normal.  GI: Soft. Bowel sounds are normal.  Genitourinary: Vagina normal.   Musculoskeletal: Normal range of motion.  Neurological: She is alert and oriented to person, place, and time. She has normal reflexes.  Skin: Skin is warm and dry.  Psychiatric: She has a normal mood and affect. Her behavior is normal.    Prenatal labs: ABO, Rh: A/POS/-- (01/03 1047) Antibody: NEG (01/03 1047) Rubella: 1.60 (01/03 1047) RPR: NON REAC (03/19 1127)  HBsAg: NEGATIVE (01/03 1047)  HIV: NON REACTIVE (01/03 1047)  GBS: Negative (05/15 0000)  GC/CT neg 5/15 Quad screen normal 1/14 hgb electrophoresis normal 1/3 1hr gtt 79 3/20 hgb 11.5 3/20 RPR NR 3/20 UA cx >100,000 multiple types 5/11   Assessment/Plan: IUP at 37wks Active labor TOLAC GBS neg FHR reassuring  Admit to b.s per Dr Normand Sloop attending Routine L&D orders Epidural ASAP Fentanyl IVP  Will check PIH labs and cath UA after foley placed after epidural  Expectant mgmnt    Aqil Goetting M 10/27/2012, 4:29 PM

## 2012-10-27 NOTE — Anesthesia Procedure Notes (Signed)
Epidural Patient location during procedure: OB Start time: 10/27/2012 5:01 PM  Staffing Performed by: anesthesiologist   Preanesthetic Checklist Completed: patient identified, site marked, surgical consent, pre-op evaluation, timeout performed, IV checked, risks and benefits discussed and monitors and equipment checked  Epidural Patient position: sitting Prep: site prepped and draped and DuraPrep Patient monitoring: continuous pulse ox and blood pressure Approach: midline Injection technique: LOR air  Needle:  Needle type: Tuohy  Needle gauge: 17 G Needle length: 9 cm and 9 Needle insertion depth: 6 cm Catheter type: closed end flexible Catheter size: 19 Gauge Catheter at skin depth: 11 cm Test dose: negative  Assessment Events: blood not aspirated, injection not painful, no injection resistance, negative IV test and no paresthesia  Additional Notes Discussed risk of headache, infection, bleeding, nerve injury and failed or incomplete block.  Patient voices understanding and wishes to proceed.  Epidural placed easily on first attempt.  No paresthesia.  Patient tolerated procedure well with no apparent complications.  Jasmine December, MD Reason for block:procedure for pain

## 2012-10-28 LAB — CBC
HCT: 35.2 % — ABNORMAL LOW (ref 36.0–46.0)
Hemoglobin: 12.6 g/dL (ref 12.0–15.0)
MCHC: 35.8 g/dL (ref 30.0–36.0)
MCV: 83.8 fL (ref 78.0–100.0)

## 2012-10-28 LAB — COMPREHENSIVE METABOLIC PANEL
ALT: 8 U/L (ref 0–35)
AST: 16 U/L (ref 0–37)
Albumin: 2.4 g/dL — ABNORMAL LOW (ref 3.5–5.2)
Calcium: 9 mg/dL (ref 8.4–10.5)
GFR calc Af Amer: >90 mL/min (ref >90)
Glucose, Bld: 76 mg/dL (ref 70–99)
Sodium: 135 mEq/L (ref 135–145)
Total Protein: 5.6 g/dL — ABNORMAL LOW (ref 6.0–8.3)

## 2012-10-28 NOTE — Progress Notes (Signed)
Post Partum Day 1: S/P VBAC with periurethral and left labial  Subjective: Patient up ad lib, denies syncope or dizziness. 24 hour urine being collect until 1820  Feeding:  Breastfeeding Contraceptive plan:   Depo  Objective: Blood pressure 111/74, pulse 81, temperature 98.6 F (37 C), temperature source Oral, resp. rate 18, height 4\' 11"  (1.499 m), weight 181 lb (82.101 kg), SpO2 96.00%, unknown if currently breastfeeding.  Physical Exam:  General: alert, cooperative and no distress Lochia: appropriate Uterine Fundus: firm Incision: healing well DVT Evaluation: No evidence of DVT seen on physical exam. Negative Homan's sign.   Recent Labs  10/27/12 1625 10/28/12 0705  HGB 13.4 12.6  HCT 37.1 35.2*   Filed Vitals:   10/27/12 2009 10/27/12 2108 10/28/12 0105 10/28/12 0520  BP: 136/91 118/80 118/81 111/74  Pulse: 90 85 83 81  Temp: 98.8 F (37.1 C) 98.9 F (37.2 C) 99.1 F (37.3 C) 98.6 F (37 C)  TempSrc: Oral Oral Oral Oral  Resp: 18 20 20 18   Height:      Weight:      SpO2: 98% 99% 97% 96%   Assessment/Plan: S/P Vaginal delivery day 1 BP seem to be stabilizing 24 hour urine  Continue current care CTO BP and lab results   LOS: 1 day   Song Garris 10/28/2012, 7:50 AM

## 2012-10-28 NOTE — Lactation Note (Signed)
This note was copied from the chart of Paula Livingston. Lactation Consultation Note  Patient Name: Paula Livingston Date: 10/28/2012 Reason for consult: Follow-up assessment   Maternal Data    Feeding Feeding Type: Breast Milk Feeding method: Breast Length of feed: 0 min  LATCH Score/Interventions Latch: Too sleepy or reluctant, no latch achieved, no sucking elicited.  Audible Swallowing: None  Type of Nipple: Flat  Comfort (Breast/Nipple): Soft / non-tender     Hold (Positioning): Assistance needed to correctly position infant at breast and maintain latch. Intervention(s): Breastfeeding basics reviewed;Support Pillows;Skin to skin  LATCH Score: 4  Lactation Tools Discussed/Used     Consult Status Consult Status: Follow-up Date: 10/28/12 Follow-up type: In-patient  Mom reports that baby has been sleepy since bath. Unwrapped and undressed and placed in cross cradle position. Baby sound asleep-would not open mouth. Mom reports that she tried about 1 hour ago.Mom states she is going to take a nap while baby is sleeping. Encouraged to page for assist at next feeding.  Pamelia Hoit 10/28/2012, 2:13 PM

## 2012-10-28 NOTE — Anesthesia Postprocedure Evaluation (Signed)
Anesthesia Post Note  Patient: Paula Livingston  Procedure(s) Performed: * No procedures listed *  Anesthesia type: Epidural  Patient location: Mother/Baby  Post pain: Pain level controlled  Post assessment: Post-op Vital signs reviewed  Last Vitals:  Filed Vitals:   10/28/12 0520  BP: 111/74  Pulse: 81  Temp: 37 C  Resp: 18    Post vital signs: Reviewed  Level of consciousness:alert  Complications: No apparent anesthesia complications

## 2012-10-29 LAB — PROTEIN, URINE, 24 HOUR
Protein, 24H Urine: 72 mg/d (ref 50–100)
Urine Total Volume-UPROT: 2400 mL

## 2012-10-29 MED ORDER — IBUPROFEN 600 MG PO TABS: 600.0000 mg | ORAL_TABLET | Freq: Four times a day (QID) | ORAL | Status: DC

## 2012-10-29 NOTE — Progress Notes (Signed)
UR chart review completed.  

## 2012-10-29 NOTE — Discharge Summary (Signed)
  Vaginal Delivery Discharge Summary  Paula Livingston  DOB:    May 31, 1986 MRN:    [id number] CSN:    [id number]  Date of admission:                  10/27/12  Date of discharge:                   10/29/12  Procedures this admission: VBAC with a periurethral and left labial lac  Date of Delivery: 10/27/12 by Almond Lint  Newborn Data:  Live born female  Birth Weight: 5 lb 7.8 oz (2490 g) APGAR: 9, 9  Home with mother. Circumcision Plan: n/a  History of Present Illness:  [name] is a 27 y.o. female, [phone], who presents at [redacted]w[redacted]d weeks gestation. The patient has been followed at the [facility] and Gynecology division of [facility]. She was admitted onset of labor. Her pregnancy has been complicated by:  Patient Active Problem List   Diagnosis Date Noted  . VBAC (vaginal birth after Cesarean) 10/27/2012  . Previous cesarean delivery, delivered, with or without mention of antepartum condition 06/16/2012  . Hx of pre-eclampsia in prior pregnancy, currently pregnant 06/16/2012  . H/O preterm delivery, currently pregnant 06/16/2012  . Desires VBAC (vaginal birth after cesarean) trial 06/16/2012    Hospital course:  The patient was admitted for labor.   Her labor was not complicated but she had elevated BP - PIH labs and 24 hour urine collected. She proceeded to have a vaginal delivery of a healthy infant. Her delivery was not complicated. Her postpartum course was not complicated. 24 hour urine was WNL. She was discharged to home on postpartum day 2 doing well.  Feeding:  breast  Contraception:  Depo-Provera  Discharge hemoglobin:  Hemoglobin  Date Value Range Status  10/28/2012 12.6  12.0 - 15.0 g/dL Final     HCT  Date Value Range Status  10/28/2012 35.2* 36.0 - 46.0 % Final    Discharge Physical Exam:   General: alert, cooperative and no distress Lochia: appropriate Uterine Fundus: firm Incision: healing well DVT  Evaluation: No evidence of DVT seen on physical exam. Negative Homan's sign.  Intrapartum Procedures: spontaneous vaginal delivery Postpartum Procedures: 24 hour urine collected Complications-Operative and Postpartum: periurethral and left labial laceration  Discharge Diagnoses: Term Pregnancy-delivered  Discharge Information:  Activity:           per [facility] handbook Diet:                routine Medications: PNV and Ibuprofen Condition:      stable Instructions:  refer to practice specific booklet Discharge to: home     [name] 10/29/2012

## 2012-10-29 NOTE — Lactation Note (Signed)
This note was copied from the chart of Paula Corlene Hodsdon. Lactation Consultation Note Mom states br feeding is going okay. States it is easier for her to feed baby on the left than the right. Baby awake and alert, offered to assist, mom accepts, request help to latch baby on right.  Mom prefers cradle over football hold, demonstrated how to use cross cradle to improve position. Mom was able to easily latch baby on the right using cross cradle. Baby latched very well, with off and on rhythmic sucking, baby was very sleepy and difficult to keep awake at the breast.  Enc mom to call for assistance if she has any concerns. Questions answered.   Patient Name: Paula Livingston [id number] Date: 10/29/2012 Reason for consult: Follow-up assessment;Infant < 6lbs   Maternal Data    Feeding Feeding Type: Breast Milk Feeding method: Breast Length of feed: 30 min  LATCH Score/Interventions Latch: Repeated attempts needed to sustain latch, nipple held in mouth throughout feeding, stimulation needed to elicit sucking reflex. Intervention(s): Skin to skin;Teach feeding cues;Waking techniques  Audible Swallowing: A few with stimulation Intervention(s): Hand expression  Type of Nipple: Everted at rest and after stimulation  Comfort (Breast/Nipple): Soft / non-tender     Hold (Positioning): Assistance needed to correctly position infant at breast and maintain latch. Intervention(s): Breastfeeding basics reviewed;Support Pillows;Position options;Skin to skin  LATCH Score: 7  Lactation Tools Discussed/Used     Consult Status Consult Status: Follow-up Date: 10/30/12 Follow-up type: In-patient    Octavio Manns [facility] 10/29/2012, 10:12 AM

## 2013-04-08 ENCOUNTER — Other Ambulatory Visit: Payer: Self-pay

## 2013-05-15 ENCOUNTER — Inpatient Hospital Stay (HOSPITAL_COMMUNITY)
Admission: AD | Admit: 2013-05-15 | Discharge: 2013-05-15 | Disposition: A | Discharge status: Home / Self Care | Payer: Self-pay | Source: Ambulatory Visit | Attending: Obstetrics and Gynecology | Admitting: Obstetrics and Gynecology

## 2013-05-15 ENCOUNTER — Encounter (HOSPITAL_COMMUNITY): Payer: Self-pay

## 2013-05-15 DIAGNOSIS — Z3490 Encounter for supervision of normal pregnancy, unspecified, unspecified trimester: Secondary | ICD-10-CM

## 2013-05-15 DIAGNOSIS — O99891 Other specified diseases and conditions complicating pregnancy: Secondary | ICD-10-CM | POA: Insufficient documentation

## 2013-05-15 DIAGNOSIS — Z3201 Encounter for pregnancy test, result positive: Secondary | ICD-10-CM

## 2013-05-15 NOTE — MAU Provider Note (Signed)
History     CSN: [id number]  Arrival date and time: 05/15/13 1057   First Provider Initiated Contact with Patient 05/15/13 1131      Chief Complaint  Patient presents with  . Pregnancy eval    HPI Paula Livingston is a 27 y.o. [phone] at [redacted]w[redacted]d who presents to MAU today for pregnancy confirmation. The patient denies abdominal pain, vaginal bleeding, vaginal discharge, N/V/D or constipation, fever or UTI symptoms. She states no chronic medical problems. She has a history of one PTD after induction for pre-eclampsia.   OB History   Grav Para Term Preterm Abortions TAB SAB Ect Mult Living   4 3 2 1  0 0 0 0 0 3     Obstetric Comments   2007-IOL FOR PREECLAMPSIA 2008 C/S FETAL DISTRESS      Past Medical History  Diagnosis Date  . Pregnancy induced hypertension 2007  . Preterm labor 2007  . Infection 2005    gonorrhea  . Infection 2005    CHLAMYDIA    Past Surgical History  Procedure Laterality Date  . Cesarean section      Family History  Problem Relation Age of Onset  . Other Neg Hx   . Alcohol abuse Mother   . Drug abuse Mother   . Aneurysm Father     BRAIN  . Alcohol abuse Sister   . Alcohol abuse Brother   . Asthma Son   . Birth defects Son     EXTRA DIGITS  . Seizures Son     FEBRILE  . Cancer Maternal Aunt 52    BREAST  . Cancer Maternal Uncle     PROSTATE  . Diabetes Maternal Uncle   . Alcohol abuse Maternal Uncle   . Drug abuse Maternal Uncle   . Cancer Maternal Uncle     PROSTATE  . Alcohol abuse Maternal Uncle   . Drug abuse Maternal Uncle   . Cancer Maternal Uncle     BRAIN  . Alcohol abuse Maternal Uncle   . Drug abuse Maternal Uncle     History  Substance Use Topics  . Smoking status: Never Smoker   . Smokeless tobacco: Never Used  . Alcohol Use: No    Allergies: No Known Allergies  Prescriptions prior to admission  Medication Sig Dispense Refill  . ibuprofen (ADVIL,MOTRIN) 600 MG tablet Take 1 tablet (600 mg total) by mouth  every 6 (six) hours.  30 tablet  0  . Prenatal Vit-Fe Fumarate-FA (PRENATAL MULTIVITAMIN) TABS Take 1 tablet by mouth daily at 12 noon.        Review of Systems  Constitutional: Negative for fever and malaise/fatigue.  Gastrointestinal: Negative for nausea, vomiting, abdominal pain, diarrhea and constipation.  Genitourinary: Negative for dysuria, urgency and frequency.       Neg - vaginal bleeding, discharge  Neurological: Negative for dizziness, loss of consciousness and weakness.   Physical Exam   Blood pressure 115/62, pulse 91, temperature 98.1 F (36.7 C), temperature source Oral, resp. rate 16, height 4\' 11"  (1.499 m), weight 174 lb 2 oz (78.983 kg), last menstrual period 02/01/2013, not currently breastfeeding.  Physical Exam  Constitutional: She is oriented to person, place, and time. She appears well-developed and well-nourished. No distress.  HENT:  Head: Normocephalic and atraumatic.  Cardiovascular: Normal rate.   Neurological: She is alert and oriented to person, place, and time.  Skin: Skin is warm and dry. No erythema.  Psychiatric: She has a normal mood and affect.  MAU Course  Procedures None  MDM FHR - 162 bpm with doppler  Assessment and Plan  A: IUP at [redacted]w[redacted]d by LMP  P: Discharge home Pregnancy confirmation letter given Patient encouraged to call [facility] to make her prenatal appointment Patient may return to MAU as needed  Freddi Starr, PA-C  05/15/2013, 11:32 AM

## 2013-05-15 NOTE — MAU Note (Signed)
Pt states LMP-beginning of September roughly, denies pain or bleeding or vaginal discharge. Here for verification letter.

## 2013-06-03 NOTE — L&D Delivery Note (Signed)
Delivery Note At 6:37 PM a viable female was delivered via Vaginal, Spontaneous Delivery (Presentation: ;  ).  APGAR: , ; weight .   Placenta status: , .  Cord:  with the following complications: .  Cord pH: NA  Anesthesia: None  Episiotomy: None Lacerations: None Suture Repair: NA Est. Blood Loss (mL): 200  Mom to postpartum.  Baby to Couplet care / Skin to Skin.  Called to delivery. Mother pushed over intact perineum precipitously. Infant delivered to maternal abdomen. Cord clamped and cut. Active management of 3rd stage with traction. Placenta delivered intact with 3v cord followed by pitocin.  [id number]. Counts correct. Hemostatic.   Tawana Scale, MD OB Fellow

## 2013-06-22 ENCOUNTER — Encounter (HOSPITAL_COMMUNITY): Payer: Self-pay | Admitting: *Deleted

## 2013-06-22 ENCOUNTER — Inpatient Hospital Stay (HOSPITAL_COMMUNITY)
Admission: AD | Admit: 2013-06-22 | Discharge: 2013-06-22 | Disposition: A | Discharge status: Home / Self Care | Payer: Medicaid Other | Source: Ambulatory Visit | Attending: Obstetrics & Gynecology | Admitting: Obstetrics & Gynecology

## 2013-06-22 DIAGNOSIS — O99891 Other specified diseases and conditions complicating pregnancy: Secondary | ICD-10-CM | POA: Insufficient documentation

## 2013-06-22 DIAGNOSIS — O0930 Supervision of pregnancy with insufficient antenatal care, unspecified trimester: Secondary | ICD-10-CM | POA: Insufficient documentation

## 2013-06-22 DIAGNOSIS — N949 Unspecified condition associated with female genital organs and menstrual cycle: Secondary | ICD-10-CM

## 2013-06-22 DIAGNOSIS — O9989 Other specified diseases and conditions complicating pregnancy, childbirth and the puerperium: Principal | ICD-10-CM

## 2013-06-22 DIAGNOSIS — R109 Unspecified abdominal pain: Secondary | ICD-10-CM | POA: Insufficient documentation

## 2013-06-22 LAB — URINALYSIS, ROUTINE W REFLEX MICROSCOPIC
BILIRUBIN URINE: NEGATIVE
GLUCOSE, UA: NEGATIVE mg/dL
HGB URINE DIPSTICK: NEGATIVE
Ketones, ur: NEGATIVE mg/dL
Leukocytes, UA: NEGATIVE
Nitrite: NEGATIVE
PROTEIN: NEGATIVE mg/dL
UROBILINOGEN UA: 1 mg/dL (ref 0.0–1.0)
pH: 5.5 (ref 5.0–8.0)

## 2013-06-22 NOTE — Discharge Instructions (Signed)
Second Trimester of Pregnancy The second trimester is from week 13 through week 28, months 4 through 6. The second trimester is often a time when you feel your best. Your body has also adjusted to being pregnant, and you begin to feel better physically. Usually, morning sickness has lessened or quit completely, you may have more energy, and you may have an increase in appetite. The second trimester is also a time when the fetus is growing rapidly. At the end of the sixth month, the fetus is about 9 inches long and weighs about 1 pounds. You will likely begin to feel the baby move (quickening) between 18 and 20 weeks of the pregnancy. BODY CHANGES Your body goes through many changes during pregnancy. The changes vary from woman to woman.   Your weight will continue to increase. You will notice your lower abdomen bulging out.  You may begin to get stretch marks on your hips, abdomen, and breasts.  You may develop headaches that can be relieved by medicines approved by your caregiver.  You may urinate more often because the fetus is pressing on your bladder.  You may develop or continue to have heartburn as a result of your pregnancy.  You may develop constipation because certain hormones are causing the muscles that push waste through your intestines to slow down.  You may develop hemorrhoids or swollen, bulging veins (varicose veins).  You may have back pain because of the weight gain and pregnancy hormones relaxing your joints between the bones in your pelvis and as a result of a shift in weight and the muscles that support your balance.  Your breasts will continue to grow and be tender.  Your gums may bleed and may be sensitive to brushing and flossing.  Dark spots or blotches (chloasma, mask of pregnancy) may develop on your face. This will likely fade after the baby is born.  A dark line from your belly button to the pubic area (linea nigra) may appear. This will likely fade after the  baby is born. WHAT TO EXPECT AT YOUR PRENATAL VISITS During a routine prenatal visit:  You will be weighed to make sure you and the fetus are growing normally.  Your blood pressure will be taken.  Your abdomen will be measured to track your baby's growth.  The fetal heartbeat will be listened to.  Any test results from the previous visit will be discussed. Your caregiver may ask you:  How you are feeling.  If you are feeling the baby move.  If you have had any abnormal symptoms, such as leaking fluid, bleeding, severe headaches, or abdominal cramping.  If you have any questions. Other tests that may be performed during your second trimester include:  Blood tests that check for:  Low iron levels (anemia).  Gestational diabetes (between 24 and 28 weeks).  Rh antibodies.  Urine tests to check for infections, diabetes, or protein in the urine.  An ultrasound to confirm the proper growth and development of the baby.  An amniocentesis to check for possible genetic problems.  Fetal screens for spina bifida and Down syndrome. HOME CARE INSTRUCTIONS   Avoid all smoking, herbs, alcohol, and unprescribed drugs. These chemicals affect the formation and growth of the baby.  Follow your caregiver's instructions regarding medicine use. There are medicines that are either safe or unsafe to take during pregnancy.  Exercise only as directed by your caregiver. Experiencing uterine cramps is a good sign to stop exercising.  Continue to eat regular,   healthy meals.  Wear a good support bra for breast tenderness.  Do not use hot tubs, steam rooms, or saunas.  Wear your seat belt at all times when driving.  Avoid raw meat, uncooked cheese, cat litter boxes, and soil used by cats. These carry germs that can cause birth defects in the baby.  Take your prenatal vitamins.  Try taking a stool softener (if your caregiver approves) if you develop constipation. Eat more high-fiber foods,  such as fresh vegetables or fruit and whole grains. Drink plenty of fluids to keep your urine clear or pale yellow.  Take warm sitz baths to soothe any pain or discomfort caused by hemorrhoids. Use hemorrhoid cream if your caregiver approves.  If you develop varicose veins, wear support hose. Elevate your feet for 15 minutes, 3 4 times a day. Limit salt in your diet.  Avoid heavy lifting, wear low heel shoes, and practice good posture.  Rest with your legs elevated if you have leg cramps or low back pain.  Visit your dentist if you have not gone yet during your pregnancy. Use a soft toothbrush to brush your teeth and be gentle when you floss.  A sexual relationship may be continued unless your caregiver directs you otherwise.  Continue to go to all your prenatal visits as directed by your caregiver. SEEK MEDICAL CARE IF:   You have dizziness.  You have mild pelvic cramps, pelvic pressure, or nagging pain in the abdominal area.  You have persistent nausea, vomiting, or diarrhea.  You have a bad smelling vaginal discharge.  You have pain with urination. SEEK IMMEDIATE MEDICAL CARE IF:   You have a fever.  You are leaking fluid from your vagina.  You have spotting or bleeding from your vagina.  You have severe abdominal cramping or pain.  You have rapid weight gain or loss.  You have shortness of breath with chest pain.  You notice sudden or extreme swelling of your face, hands, ankles, feet, or legs.  You have not felt your baby move in over an hour.  You have severe headaches that do not go away with medicine.  You have vision changes. Document Released: 05/14/2001 Document Revised: 01/20/2013 Document Reviewed: 07/21/2012 Exit[employer] Patient Information 2014 [employer].  

## 2013-06-22 NOTE — MAU Provider Note (Signed)
None     Chief Complaint:  Abdominal Pain   Paula HazelBonita Livingston is  28 y.o. [phone]  EDC and no prior prenatal care who presents at [redacted] weeks gestation by her estimate based on LMP, with one week history of bilateral lower abd pain.  Sharp, sudden. Pain is worse with activity, relieved with rest.  No medication. She does not recall having pain like this in prior pregnancy and wanted to get checked out in the context of her lack of prenatal care.  No pain currently   Denies contractions, LOF, VB, FM.    Prior pregnancies complicated as follows:  1st- HTN and Pre-E.   2nd- emergent c-s d/t fetal distress, unknown cause.   3rd- VBAC, no complications.   [facility], but is waiting for Medicaid to be seen - she is planning on it going through this week or next.  Obstetrical/Gynecological History: OB History   Grav Para Term Preterm Abortions TAB SAB Ect Mult Living   4 3 2 1  0 0 0 0 0 3     Obstetric Comments   2007-IOL FOR PREECLAMPSIA 2008 C/S FETAL DISTRESS     Past Medical History: Past Medical History  Diagnosis Date  . Pregnancy induced hypertension 2007  . Preterm labor 2007  . Infection 2005    gonorrhea  . Infection 2005    CHLAMYDIA    Past Surgical History: Past Surgical History  Procedure Laterality Date  . Cesarean section      Family History: Family History  Problem Relation Age of Onset  . Other Neg Hx   . Alcohol abuse Mother   . Drug abuse Mother   . Aneurysm Father     BRAIN  . Alcohol abuse Sister   . Alcohol abuse Brother   . Asthma Son   . Birth defects Son     EXTRA DIGITS  . Seizures Son     FEBRILE  . Cancer Maternal Aunt 52    BREAST  . Cancer Maternal Uncle     PROSTATE  . Diabetes Maternal Uncle   . Alcohol abuse Maternal Uncle   . Drug abuse Maternal Uncle   . Cancer Maternal Uncle     PROSTATE  . Alcohol abuse Maternal Uncle   . Drug abuse Maternal Uncle   . Cancer Maternal Uncle     BRAIN  .  Alcohol abuse Maternal Uncle   . Drug abuse Maternal Uncle     Social History: History  Substance Use Topics  . Smoking status: Never Smoker   . Smokeless tobacco: Never Used  . Alcohol Use: No    Allergies: No Known Allergies  Meds:  Prescriptions prior to admission  Medication Sig Dispense Refill  . Prenatal Vit-Fe Fumarate-FA (PRENATAL MULTIVITAMIN) TABS Take 1 tablet by mouth daily at 12 noon.        Review of Systems -   Review of Systems  Denies CP, palpitations Denies shob, cough Denies N/V/D loss of apt. Denies dysuria    Physical Exam  Blood pressure 119/61, pulse 81, temperature 98.8 F (37.1 C), temperature source Oral, resp. rate 18, height 4\' 11"  (1.499 m), weight 79.379 kg (175 lb), last menstrual period 02/01/2013. VSS    CV- RRR Pulm - CTABL Abd - obese. Soft. Uterus firm, gravid inferior to umbilicus ~18wk Ext - 2+ pulses in four extremities. No LE edema. Neuro -  no focal deficits.   FHR on Doppler 157 per nurse.  Labs: Results for orders placed during the hospital encounter of 06/22/13 (from the past 24 hour(s))  URINALYSIS, ROUTINE W REFLEX MICROSCOPIC   Collection Time    06/22/13  3:05 PM      Result Value Range   Color, Urine YELLOW  YELLOW   APPearance CLEAR  CLEAR   Specific Gravity, Urine >1.030 (*) 1.005 - 1.030   pH 5.5  5.0 - 8.0   Glucose, UA NEGATIVE  NEGATIVE mg/dL   Hgb urine dipstick NEGATIVE  NEGATIVE   Bilirubin Urine NEGATIVE  NEGATIVE   Ketones, ur NEGATIVE  NEGATIVE mg/dL   Protein, ur NEGATIVE  NEGATIVE mg/dL   Urobilinogen, UA 1.0  0.0 - 1.0 mg/dL   Nitrite NEGATIVE  NEGATIVE   Leukocytes, UA NEGATIVE  NEGATIVE   Imaging Studies:  No results found.  Assessment: Paula Livingston is  28 y.o. [phone] at [redacted]w[redacted]d presents with abdominal pain c/w round ligament pain. Pt has no evidence of infection and reassuring UA. Provided patient with resources if unable to establish care at [facility]. Pt states understanding and will  follow up for PTL Paula Livingston 1/20/20154:58 PM

## 2013-06-22 NOTE — MAU Note (Signed)
C/o abdominal cramping for past week; denies any bleeding;

## 2013-06-22 NOTE — MAU Provider Note (Signed)
Attestation of Attending Supervision of Advanced Practitioner (CNM/NP): Evaluation and management procedures were performed by the Advanced Practitioner under my supervision and collaboration.  I have reviewed the Advanced Practitioner's note and chart, and I agree with the management and plan.  HARRAWAY-SMITH, Zaccary Creech 10:01 PM

## 2013-07-05 ENCOUNTER — Other Ambulatory Visit (HOSPITAL_COMMUNITY): Payer: Self-pay | Admitting: Nurse Practitioner

## 2013-07-05 DIAGNOSIS — Z87768 Personal history of other specified (corrected) congenital malformations of integument, limbs and musculoskeletal system: Secondary | ICD-10-CM

## 2013-07-05 DIAGNOSIS — Z8776 Personal history of (corrected) congenital malformations of integument, limbs and musculoskeletal system: Secondary | ICD-10-CM

## 2013-07-05 DIAGNOSIS — Z3689 Encounter for other specified antenatal screening: Secondary | ICD-10-CM

## 2013-07-05 LAB — OB RESULTS CONSOLE HGB/HCT, BLOOD
HEMATOCRIT: 36 %
HEMOGLOBIN: 11.7 g/dL

## 2013-07-05 LAB — OB RESULTS CONSOLE HIV ANTIBODY (ROUTINE TESTING): HIV: NONREACTIVE

## 2013-07-05 LAB — OB RESULTS CONSOLE ANTIBODY SCREEN: Antibody Screen: NEGATIVE

## 2013-07-05 LAB — GLUCOSE TOLERANCE, 1 HOUR: GLUCOSE 1 HOUR GTT: 103

## 2013-07-05 LAB — OB RESULTS CONSOLE HEPATITIS B SURFACE ANTIGEN: Hepatitis B Surface Ag: NEGATIVE

## 2013-07-05 LAB — OB RESULTS CONSOLE RPR: RPR: NONREACTIVE

## 2013-07-05 LAB — OB RESULTS CONSOLE RUBELLA ANTIBODY, IGM: RUBELLA: IMMUNE

## 2013-07-05 LAB — OB RESULTS CONSOLE GC/CHLAMYDIA
Chlamydia: NEGATIVE
GC PROBE AMP, GENITAL: NEGATIVE

## 2013-07-05 LAB — CYSTIC FIBROSIS DIAGNOSTIC STUDY

## 2013-07-05 LAB — OB RESULTS CONSOLE VARICELLA ZOSTER ANTIBODY, IGG: Varicella: IMMUNE

## 2013-07-05 LAB — OB RESULTS CONSOLE ABO/RH: RH TYPE: POSITIVE

## 2013-07-05 LAB — OB RESULTS CONSOLE PLATELET COUNT: Platelets: 248 10*3/uL

## 2013-07-05 LAB — URINE CULTURE, REFLEX: URINE CULTURE, OB: NEGATIVE

## 2013-07-14 ENCOUNTER — Other Ambulatory Visit: Payer: Self-pay

## 2013-07-14 ENCOUNTER — Ambulatory Visit (HOSPITAL_COMMUNITY)
Admission: RE | Admit: 2013-07-14 | Discharge: 2013-07-14 | Disposition: A | Discharge status: Home / Self Care | Payer: Medicaid Other | Source: Ambulatory Visit | Attending: Nurse Practitioner | Admitting: Nurse Practitioner

## 2013-07-14 ENCOUNTER — Other Ambulatory Visit: Payer: Self-pay | Admitting: Obstetrics & Gynecology

## 2013-07-14 ENCOUNTER — Encounter (HOSPITAL_COMMUNITY): Payer: Self-pay

## 2013-07-14 DIAGNOSIS — Z87768 Personal history of other specified (corrected) congenital malformations of integument, limbs and musculoskeletal system: Secondary | ICD-10-CM

## 2013-07-14 DIAGNOSIS — Z3689 Encounter for other specified antenatal screening: Secondary | ICD-10-CM

## 2013-07-14 DIAGNOSIS — Z1389 Encounter for screening for other disorder: Secondary | ICD-10-CM | POA: Insufficient documentation

## 2013-07-14 DIAGNOSIS — O352XX0 Maternal care for (suspected) hereditary disease in fetus, not applicable or unspecified: Secondary | ICD-10-CM | POA: Insufficient documentation

## 2013-07-14 DIAGNOSIS — O34219 Maternal care for unspecified type scar from previous cesarean delivery: Secondary | ICD-10-CM | POA: Insufficient documentation

## 2013-07-14 DIAGNOSIS — Z8751 Personal history of pre-term labor: Secondary | ICD-10-CM | POA: Insufficient documentation

## 2013-07-14 DIAGNOSIS — Z8776 Personal history of (corrected) congenital malformations of integument, limbs and musculoskeletal system: Secondary | ICD-10-CM

## 2013-07-14 DIAGNOSIS — O358XX0 Maternal care for other (suspected) fetal abnormality and damage, not applicable or unspecified: Secondary | ICD-10-CM | POA: Insufficient documentation

## 2013-07-14 DIAGNOSIS — Z363 Encounter for antenatal screening for malformations: Secondary | ICD-10-CM | POA: Insufficient documentation

## 2013-07-14 DIAGNOSIS — O09299 Supervision of pregnancy with other poor reproductive or obstetric history, unspecified trimester: Secondary | ICD-10-CM | POA: Insufficient documentation

## 2013-07-14 LAB — QUAD SCREEN FOR MFM

## 2013-07-14 NOTE — Progress Notes (Signed)
Genetic Counseling  Visit Summary Note  Appointment Date: 07/14/2013 Referred By: Paula Spindlearson, Ashley C, NP  Date of Birth: 09-Dec-1985  Pregnancy history: [id number] Estimated Date of Delivery: 11/26/13 Estimated Gestational Age: 3711w5d  [name] for genetic counseling because of a personal and family history of polydactyly.  We began by reviewing the history in detail.  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  In addition, her son was also born with postaxial polydactyly of the hands.  [name] and her son are otherwise healthy and have no other physical differences or intellectual disability.  The remainder of the family histories were found to be noncontributory for birth defects, mental retardation, and known genetic conditions. Without further information regarding the provided family history, an accurate genetic risk cannot be calculated. Further genetic counseling is warranted if more information is obtained.  [name] was counseled that polydactyly is defined as the presence of a supernumerary digit that extends from either the great toe/thumb side (preaxial) or the pinky toe/little finger side (postaxial).  This digit may or may not contain bone.  Polydactyly can be a feature of an underlying genetic syndrome or an isolated finding.  There are many genes known to contribute to the development of isolated polydactyly and changes within these genes can cause the presence of an extra digit.  Isolated polydactyly is most often inherited as a dominant trait; however reduced penetrance and genetic heterogeneity are observed.  [name] was counseled that assuming she has nonsyndromic polydactyly, the fetus has a 50% chance to inherit the gene alteration.  By ultrasound today, there was no evidence of polydactyly; however the patient was again counseled that the fetus could have inherited a gene change, but due to reduced penetrance may not show features of  the gene change (polydactyly).  In this case, the mutation may still be passed on in future generations.    We discussed the availability of a detailed ultrasound to assess for polydactyly in the pregnancy. The patient understands that ultrasound cannot rule out all birth defects prenatally.  She elected to have a detailed ultrasound today.  The report will be documented separately.  There were no visualized extra digits, anomalies, or markers for aneuploidy.  A new EDC was given today, consistent with a gestational age of 8511w5d gestation.  Given this dating, we discussed the option of routine screening for fetal aneuploidy.  [name] elected to have maternal serum Quad screening today.    [name] was provided with written information regarding sickle cell anemia (SCA) including the carrier frequency and incidence in the African-American population, the availability of carrier testing and prenatal diagnosis if indicated.  In addition, we discussed that hemoglobinopathies are routinely screened for as part of the [facility] newborn screening panel.  She declined hemoglobin electrophoresis today.   [name] denied exposure to environmental toxins or chemical agents. She denied the use of alcohol, tobacco or street drugs. She denied significant viral illnesses during the course of her pregnancy.   I counseled Paula Livingston regarding the above risks and available options.  The approximate face-to-face time with the genetic counselor was 30 minutes.  Donald Prosehristy S. Vaudie Engebretsen, MS Certified Genetic Counselor

## 2013-07-20 DIAGNOSIS — O0930 Supervision of pregnancy with insufficient antenatal care, unspecified trimester: Secondary | ICD-10-CM | POA: Insufficient documentation

## 2013-07-20 DIAGNOSIS — E669 Obesity, unspecified: Secondary | ICD-10-CM | POA: Insufficient documentation

## 2013-07-20 DIAGNOSIS — Z141 Cystic fibrosis carrier: Secondary | ICD-10-CM | POA: Insufficient documentation

## 2013-07-20 DIAGNOSIS — O09299 Supervision of pregnancy with other poor reproductive or obstetric history, unspecified trimester: Secondary | ICD-10-CM | POA: Insufficient documentation

## 2013-07-20 DIAGNOSIS — Z8632 Personal history of gestational diabetes: Secondary | ICD-10-CM

## 2013-07-20 DIAGNOSIS — O09219 Supervision of pregnancy with history of pre-term labor, unspecified trimester: Secondary | ICD-10-CM

## 2013-07-20 DIAGNOSIS — O09899 Supervision of other high risk pregnancies, unspecified trimester: Secondary | ICD-10-CM | POA: Insufficient documentation

## 2013-07-20 DIAGNOSIS — Q899 Congenital malformation, unspecified: Secondary | ICD-10-CM | POA: Insufficient documentation

## 2013-07-22 ENCOUNTER — Encounter: Payer: Self-pay | Admitting: Obstetrics & Gynecology

## 2013-07-22 ENCOUNTER — Ambulatory Visit (INDEPENDENT_AMBULATORY_CARE_PROVIDER_SITE_OTHER): Payer: Medicaid Other | Admitting: Obstetrics & Gynecology

## 2013-07-22 VITALS — BP 111/67 | Temp 97.0°F | Wt 175.1 lb

## 2013-07-22 DIAGNOSIS — O0992 Supervision of high risk pregnancy, unspecified, second trimester: Secondary | ICD-10-CM | POA: Insufficient documentation

## 2013-07-22 DIAGNOSIS — O09299 Supervision of pregnancy with other poor reproductive or obstetric history, unspecified trimester: Secondary | ICD-10-CM

## 2013-07-22 DIAGNOSIS — O0930 Supervision of pregnancy with insufficient antenatal care, unspecified trimester: Secondary | ICD-10-CM

## 2013-07-22 DIAGNOSIS — Z23 Encounter for immunization: Secondary | ICD-10-CM

## 2013-07-22 DIAGNOSIS — O09219 Supervision of pregnancy with history of pre-term labor, unspecified trimester: Secondary | ICD-10-CM

## 2013-07-22 DIAGNOSIS — Z141 Cystic fibrosis carrier: Secondary | ICD-10-CM

## 2013-07-22 LAB — POCT URINALYSIS DIP (DEVICE)
Bilirubin Urine: NEGATIVE
Glucose, UA: NEGATIVE mg/dL
Hgb urine dipstick: NEGATIVE
KETONES UR: NEGATIVE mg/dL
LEUKOCYTES UA: NEGATIVE
NITRITE: NEGATIVE
PH: 7 (ref 5.0–8.0)
PROTEIN: NEGATIVE mg/dL
Specific Gravity, Urine: 1.02 (ref 1.005–1.030)
UROBILINOGEN UA: 1 mg/dL (ref 0.0–1.0)

## 2013-07-22 NOTE — Progress Notes (Signed)
Pulse- 87 

## 2013-07-22 NOTE — Progress Notes (Signed)
Pt is here to start HR prenatal care.  Pt has history of PIH at 4532 qweeks (needs daily baby asa).  Pt has history of c/s with sucessful VBAC.  Pt has history of gestational DM with first pregnancy (early one hour done at health dept and we are waiting on results).  Pt has history of IUGR>  Will monitor growth.  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

## 2013-07-22 NOTE — Progress Notes (Signed)
1 hour glucose from [facility] on 07/05/13 was 103. Maternity medical records will fax the result over.

## 2013-07-23 LAB — PRESCRIPTION MONITORING PROFILE (19 PANEL)
AMPHETAMINE/METH: NEGATIVE ng/mL
BUPRENORPHINE, URINE: NEGATIVE ng/mL
Barbiturate Screen, Urine: NEGATIVE ng/mL
Benzodiazepine Screen, Urine: NEGATIVE ng/mL
CANNABINOID SCRN UR: NEGATIVE ng/mL
CARISOPRODOL, URINE: NEGATIVE ng/mL
COCAINE METABOLITES: NEGATIVE ng/mL
CREATININE, URINE: 120.23 mg/dL (ref >20.0)
ECSTASY: NEGATIVE ng/mL
FENTANYL URINE: NEGATIVE ng/mL
METHADONE SCREEN, URINE: NEGATIVE ng/mL
METHAQUALONE SCREEN (URINE): NEGATIVE ng/mL
Meperidine, Ur: NEGATIVE ng/mL
Nitrites, Initial: NEGATIVE ug/mL
OXYCODONE SCRN UR: NEGATIVE ng/mL
Opiate Screen, Urine: NEGATIVE ng/mL
PHENCYCLIDINE, UR: NEGATIVE ng/mL
Propoxyphene: NEGATIVE ng/mL
TAPENTADOLUR: NEGATIVE ng/mL
Tramadol Scrn, Ur: NEGATIVE ng/mL
Zolpidem, Urine: NEGATIVE ng/mL
pH, Initial: 7.1 pH (ref 4.5–8.9)

## 2013-07-24 LAB — CULTURE, OB URINE: Colony Count: 60000

## 2013-08-11 ENCOUNTER — Encounter: Payer: Self-pay | Admitting: *Deleted

## 2013-08-19 ENCOUNTER — Ambulatory Visit (INDEPENDENT_AMBULATORY_CARE_PROVIDER_SITE_OTHER): Payer: Medicaid Other | Admitting: Family Medicine

## 2013-08-19 VITALS — BP 124/70 | Temp 97.6°F | Wt 177.3 lb

## 2013-08-19 DIAGNOSIS — O0992 Supervision of high risk pregnancy, unspecified, second trimester: Secondary | ICD-10-CM

## 2013-08-19 DIAGNOSIS — O34219 Maternal care for unspecified type scar from previous cesarean delivery: Secondary | ICD-10-CM

## 2013-08-19 LAB — POCT URINALYSIS DIP (DEVICE)
BILIRUBIN URINE: NEGATIVE
Glucose, UA: NEGATIVE mg/dL
Hgb urine dipstick: NEGATIVE
KETONES UR: NEGATIVE mg/dL
Nitrite: NEGATIVE
PH: 6 (ref 5.0–8.0)
PROTEIN: NEGATIVE mg/dL
SPECIFIC GRAVITY, URINE: 1.025 (ref 1.005–1.030)
Urobilinogen, UA: 2 mg/dL — ABNORMAL HIGH (ref 0.0–1.0)

## 2013-08-19 NOTE — Progress Notes (Signed)
Doing well. 28 wk labs next visit VBAC consent signed

## 2013-08-19 NOTE — Progress Notes (Signed)
P=  Pt reports daily nosebleeds

## 2013-08-19 NOTE — Patient Instructions (Signed)
Breastfeeding Deciding to breastfeed is one of the best choices you can make for you and your baby. A change in hormones during pregnancy causes your breast tissue to grow and increases the number and size of your milk ducts. These hormones also allow proteins, sugars, and fats from your blood supply to make breast milk in your milk-producing glands. Hormones prevent breast milk from being released before your baby is born as well as prompt milk flow after birth. Once breastfeeding has begun, thoughts of your baby, as well as his or her sucking or crying, can stimulate the release of milk from your milk-producing glands.  BENEFITS OF BREASTFEEDING For Your Baby  Your first milk (colostrum) helps your baby's digestive system function better.   There are antibodies in your milk that help your baby fight off infections.   Your baby has a lower incidence of asthma, allergies, and sudden infant death syndrome.   The nutrients in breast milk are better for your baby than infant formulas and are designed uniquely for your baby's needs.   Breast milk improves your baby's brain development.   Your baby is less likely to develop other conditions, such as childhood obesity, asthma, or type 2 diabetes mellitus.  For You   Breastfeeding helps to create a very special bond between you and your baby.   Breastfeeding is convenient. Breast milk is always available at the correct temperature and costs nothing.   Breastfeeding helps to burn calories and helps you lose the weight gained during pregnancy.   Breastfeeding makes your uterus contract to its prepregnancy size faster and slows bleeding (lochia) after you give birth.   Breastfeeding helps to lower your risk of developing type 2 diabetes mellitus, osteoporosis, and breast or ovarian cancer later in life. SIGNS THAT YOUR BABY IS HUNGRY Early Signs of Hunger  Increased alertness or activity.  Stretching.  Movement of the head from  side to side.  Movement of the head and opening of the mouth when the corner of the mouth or cheek is stroked (rooting).  Increased sucking sounds, smacking lips, cooing, sighing, or squeaking.  Hand-to-mouth movements.  Increased sucking of fingers or hands. Late Signs of Hunger  Fussing.  Intermittent crying. Extreme Signs of Hunger Signs of extreme hunger will require calming and consoling before your baby will be able to breastfeed successfully. Do not wait for the following signs of extreme hunger to occur before you initiate breastfeeding:   Restlessness.  A loud, strong cry.   Screaming. BREASTFEEDING BASICS Breastfeeding Initiation  Find a comfortable place to sit or lie down, with your neck and back well supported.  Place a pillow or rolled up blanket under your baby to bring him or her to the level of your breast (if you are seated). Nursing pillows are specially designed to help support your arms and your baby while you breastfeed.  Make sure that your baby's abdomen is facing your abdomen.   Gently massage your breast. With your fingertips, massage from your chest wall toward your nipple in a circular motion. This encourages milk flow. You may need to continue this action during the feeding if your milk flows slowly.  Support your breast with 4 fingers underneath and your thumb above your nipple. Make sure your fingers are well away from your nipple and your baby's mouth.   Stroke your baby's lips gently with your finger or nipple.   When your baby's mouth is open wide enough, quickly bring your baby to your   breast, placing your entire nipple and as much of the colored area around your nipple (areola) as possible into your baby's mouth.   More areola should be visible above your baby's upper lip than below the lower lip.   Your baby's tongue should be between his or her lower gum and your breast.   Ensure that your baby's mouth is correctly positioned  around your nipple (latched). Your baby's lips should create a seal on your breast and be turned out (everted).  It is common for your baby to suck about 2 3 minutes in order to start the flow of breast milk. Latching Teaching your baby how to latch on to your breast properly is very important. An improper latch can cause nipple pain and decreased milk supply for you and poor weight gain in your baby. Also, if your baby is not latched onto your nipple properly, he or she may swallow some air during feeding. This can make your baby fussy. Burping your baby when you switch breasts during the feeding can help to get rid of the air. However, teaching your baby to latch on properly is still the best way to prevent fussiness from swallowing air while breastfeeding. Signs that your baby has successfully latched on to your nipple:    Silent tugging or silent sucking, without causing you pain.   Swallowing heard between every 3 4 sucks.    Muscle movement above and in front of his or her ears while sucking.  Signs that your baby has not successfully latched on to nipple:   Sucking sounds or smacking sounds from your baby while breastfeeding.  Nipple pain. If you think your baby has not latched on correctly, slip your finger into the corner of your baby's mouth to break the suction and place it between your baby's gums. Attempt breastfeeding initiation again. Signs of Successful Breastfeeding Signs from your baby:   A gradual decrease in the number of sucks or complete cessation of sucking.   Falling asleep.   Relaxation of his or her body.   Retention of a small amount of milk in his or her mouth.   Letting go of your breast by himself or herself. Signs from you:  Breasts that have increased in firmness, weight, and size 1 3 hours after feeding.   Breasts that are softer immediately after breastfeeding.  Increased milk volume, as well as a change in milk consistency and color by  the 5th day of breastfeeding.   Nipples that are not sore, cracked, or bleeding. Signs That Your Baby is Getting Enough Milk  Wetting at least 3 diapers in a 24-hour period. The urine should be clear and pale yellow by age 5 days.  At least 3 stools in a 24-hour period by age 5 days. The stool should be soft and yellow.  At least 3 stools in a 24-hour period by age 7 days. The stool should be seedy and yellow.  No loss of weight greater than 10% of birth weight during the first 3 days of age.  Average weight gain of 4 7 ounces (120 210 mL) per week after age 4 days.  Consistent daily weight gain by age 5 days, without weight loss after the age of 2 weeks. After a feeding, your baby may spit up a small amount. This is common. BREASTFEEDING FREQUENCY AND DURATION Frequent feeding will help you make more milk and can prevent sore nipples and breast engorgement. Breastfeed when you feel the need to reduce   the fullness of your breasts or when your baby shows signs of hunger. This is called "breastfeeding on demand." Avoid introducing a pacifier to your baby while you are working to establish breastfeeding (the first 4 6 weeks after your baby is born). After this time you may choose to use a pacifier. Research has shown that pacifier use during the first year of a baby's life decreases the risk of sudden infant death syndrome (SIDS). Allow your baby to feed on each breast as long as he or she wants. Breastfeed until your baby is finished feeding. When your baby unlatches or falls asleep while feeding from the first breast, offer the second breast. Because newborns are often sleepy in the first few weeks of life, you may need to awaken your baby to get him or her to feed. Breastfeeding times will vary from baby to baby. However, the following rules can serve as a guide to help you ensure that your baby is properly fed:  Newborns (babies 4 weeks of age or younger) may breastfeed every 1 3  hours.  Newborns should not go longer than 3 hours during the day or 5 hours during the night without breastfeeding.  You should breastfeed your baby a minimum of 8 times in a 24-hour period until you begin to introduce solid foods to your baby at around 6 months of age. BREAST MILK PUMPING Pumping and storing breast milk allows you to ensure that your baby is exclusively fed your breast milk, even at times when you are unable to breastfeed. This is especially important if you are going back to work while you are still breastfeeding or when you are not able to be present during feedings. Your lactation consultant can give you guidelines on how long it is safe to store breast milk.  A breast pump is a machine that allows you to pump milk from your breast into a sterile bottle. The pumped breast milk can then be stored in a refrigerator or freezer. Some breast pumps are operated by hand, while others use electricity. Ask your lactation consultant which type will work best for you. Breast pumps can be purchased, but some hospitals and breastfeeding support groups lease breast pumps on a monthly basis. A lactation consultant can teach you how to hand express breast milk, if you prefer not to use a pump.  CARING FOR YOUR BREASTS WHILE YOU BREASTFEED Nipples can become dry, cracked, and sore while breastfeeding. The following recommendations can help keep your breasts moisturized and healthy:  Avoid using soap on your nipples.   Wear a supportive bra. Although not required, special nursing bras and tank tops are designed to allow access to your breasts for breastfeeding without taking off your entire bra or top. Avoid wearing underwire style bras or extremely tight bras.  Air dry your nipples for 3 4minutes after each feeding.   Use only cotton bra pads to absorb leaked breast milk. Leaking of breast milk between feedings is normal.   Use lanolin on your nipples after breastfeeding. Lanolin helps to  maintain your skin's normal moisture barrier. If you use pure lanolin you do not need to wash it off before feeding your baby again. Pure lanolin is not toxic to your baby. You may also hand express a few drops of breast milk and gently massage that milk into your nipples and allow the milk to air dry. In the first few weeks after giving birth, some women experience extremely full breasts (engorgement). Engorgement can make   your breasts feel heavy, warm, and tender to the touch. Engorgement peaks within 3 5 days after you give birth. The following recommendations can help ease engorgement:  Completely empty your breasts while breastfeeding or pumping. You may want to start by applying warm, moist heat (in the shower or with warm water-soaked hand towels) just before feeding or pumping. This increases circulation and helps the milk flow. If your baby does not completely empty your breasts while breastfeeding, pump any extra milk after he or she is finished.  Wear a snug bra (nursing or regular) or tank top for 1 2 days to signal your body to slightly decrease milk production.  Apply ice packs to your breasts, unless this is too uncomfortable for you.  Make sure that your baby is latched on and positioned properly while breastfeeding. If engorgement persists after 48 hours of following these recommendations, contact your health care provider or a lactation consultant. OVERALL HEALTH CARE RECOMMENDATIONS WHILE BREASTFEEDING  Eat healthy foods. Alternate between meals and snacks, eating 3 of each per day. Because what you eat affects your breast milk, some of the foods may make your baby more irritable than usual. Avoid eating these foods if you are sure that they are negatively affecting your baby.  Drink milk, fruit juice, and water to satisfy your thirst (about 10 glasses a day).   Rest often, relax, and continue to take your prenatal vitamins to prevent fatigue, stress, and anemia.  Continue  breast self-awareness checks.  Avoid chewing and smoking tobacco.  Avoid alcohol and drug use. Some medicines that may be harmful to your baby can pass through breast milk. It is important to ask your health care provider before taking any medicine, including all over-the-counter and prescription medicine as well as vitamin and herbal supplements. It is possible to become pregnant while breastfeeding. If birth control is desired, ask your health care provider about options that will be safe for your baby. SEEK MEDICAL CARE IF:   You feel like you want to stop breastfeeding or have become frustrated with breastfeeding.  You have painful breasts or nipples.  Your nipples are cracked or bleeding.  Your breasts are red, tender, or warm.  You have a swollen area on either breast.  You have a fever or chills.  You have nausea or vomiting.  You have drainage other than breast milk from your nipples.  Your breasts do not become full before feedings by the 5th day after you give birth.  You feel sad and depressed.  Your baby is too sleepy to eat well.  Your baby is having trouble sleeping.   Your baby is wetting less than 3 diapers in a 24-hour period.  Your baby has less than 3 stools in a 24-hour period.  Your baby's skin or the white part of his or her eyes becomes yellow.   Your baby is not gaining weight by 5 days of age. SEEK IMMEDIATE MEDICAL CARE IF:   Your baby is overly tired (lethargic) and does not want to wake up and feed.  Your baby develops an unexplained fever. Document Released: 05/20/2005 Document Revised: 01/20/2013 Document Reviewed: 11/11/2012 [employer] Patient Information 2014 [employer].  

## 2013-08-23 ENCOUNTER — Encounter: Payer: Self-pay | Admitting: *Deleted

## 2013-08-25 ENCOUNTER — Ambulatory Visit (HOSPITAL_COMMUNITY): Admission: RE | Admit: 2013-08-25 | Payer: Medicaid Other | Source: Ambulatory Visit

## 2013-08-26 ENCOUNTER — Other Ambulatory Visit: Payer: Self-pay | Admitting: Obstetrics & Gynecology

## 2013-08-26 ENCOUNTER — Ambulatory Visit (HOSPITAL_COMMUNITY)
Admission: RE | Admit: 2013-08-26 | Discharge: 2013-08-26 | Disposition: A | Discharge status: Home / Self Care | Payer: Medicaid Other | Source: Ambulatory Visit | Attending: Obstetrics & Gynecology | Admitting: Obstetrics & Gynecology

## 2013-08-26 DIAGNOSIS — Z87768 Personal history of other specified (corrected) congenital malformations of integument, limbs and musculoskeletal system: Secondary | ICD-10-CM

## 2013-08-26 DIAGNOSIS — Z8776 Personal history of (corrected) congenital malformations of integument, limbs and musculoskeletal system: Secondary | ICD-10-CM

## 2013-08-26 DIAGNOSIS — O352XX0 Maternal care for (suspected) hereditary disease in fetus, not applicable or unspecified: Secondary | ICD-10-CM | POA: Insufficient documentation

## 2013-08-26 DIAGNOSIS — O09299 Supervision of pregnancy with other poor reproductive or obstetric history, unspecified trimester: Secondary | ICD-10-CM | POA: Insufficient documentation

## 2013-08-26 DIAGNOSIS — Z8751 Personal history of pre-term labor: Secondary | ICD-10-CM | POA: Insufficient documentation

## 2013-08-26 DIAGNOSIS — O34219 Maternal care for unspecified type scar from previous cesarean delivery: Secondary | ICD-10-CM | POA: Insufficient documentation

## 2013-09-02 ENCOUNTER — Encounter: Payer: Medicaid Other | Admitting: Obstetrics & Gynecology

## 2013-09-14 ENCOUNTER — Ambulatory Visit (INDEPENDENT_AMBULATORY_CARE_PROVIDER_SITE_OTHER): Payer: Medicaid Other | Admitting: Obstetrics & Gynecology

## 2013-09-14 VITALS — BP 118/70 | Temp 97.0°F | Wt 178.6 lb

## 2013-09-14 DIAGNOSIS — O0990 Supervision of high risk pregnancy, unspecified, unspecified trimester: Secondary | ICD-10-CM

## 2013-09-14 DIAGNOSIS — O34219 Maternal care for unspecified type scar from previous cesarean delivery: Secondary | ICD-10-CM

## 2013-09-14 DIAGNOSIS — O09219 Supervision of pregnancy with history of pre-term labor, unspecified trimester: Secondary | ICD-10-CM

## 2013-09-14 DIAGNOSIS — Z23 Encounter for immunization: Secondary | ICD-10-CM

## 2013-09-14 DIAGNOSIS — O09299 Supervision of pregnancy with other poor reproductive or obstetric history, unspecified trimester: Secondary | ICD-10-CM

## 2013-09-14 DIAGNOSIS — O0930 Supervision of pregnancy with insufficient antenatal care, unspecified trimester: Secondary | ICD-10-CM

## 2013-09-14 DIAGNOSIS — O0992 Supervision of high risk pregnancy, unspecified, second trimester: Secondary | ICD-10-CM

## 2013-09-14 DIAGNOSIS — O09899 Supervision of other high risk pregnancies, unspecified trimester: Secondary | ICD-10-CM

## 2013-09-14 LAB — CBC
HCT: 32.5 % — ABNORMAL LOW (ref 36.0–46.0)
HEMOGLOBIN: 11.5 g/dL — AB (ref 12.0–15.0)
MCH: 29.6 pg (ref 26.0–34.0)
MCHC: 35.4 g/dL (ref 30.0–36.0)
MCV: 83.5 fL (ref 78.0–100.0)
Platelets: 216 10*3/uL (ref 150–400)
RBC: 3.89 MIL/uL (ref 3.87–5.11)
RDW: 14.4 % (ref 11.5–15.5)
WBC: 6.4 10*3/uL (ref 4.0–10.5)

## 2013-09-14 LAB — POCT URINALYSIS DIP (DEVICE)
Bilirubin Urine: NEGATIVE
Glucose, UA: NEGATIVE mg/dL
Hgb urine dipstick: NEGATIVE
Ketones, ur: NEGATIVE mg/dL
LEUKOCYTES UA: NEGATIVE
NITRITE: NEGATIVE
Protein, ur: NEGATIVE mg/dL
Specific Gravity, Urine: 1.02 (ref 1.005–1.030)
Urobilinogen, UA: 2 mg/dL — ABNORMAL HIGH (ref 0.0–1.0)
pH: 7 (ref 5.0–8.0)

## 2013-09-14 MED ORDER — TETANUS-DIPHTH-ACELL PERTUSSIS 5-2.5-18.5 LF-MCG/0.5 IM SUSP: 0.5000 mL | Freq: Once | INTRAMUSCULAR | Status: DC

## 2013-09-14 NOTE — Progress Notes (Signed)
1 hr GTT, labs, Tdap today. No other complaints or concerns.  Fetal movement and labor precautions reviewed.

## 2013-09-14 NOTE — Patient Instructions (Signed)
Return to clinic for any obstetric concerns or go to [facility] for evaluation  

## 2013-09-14 NOTE — Progress Notes (Signed)
Pulse: 93

## 2013-09-15 ENCOUNTER — Encounter: Payer: Self-pay | Admitting: Obstetrics & Gynecology

## 2013-09-15 LAB — GLUCOSE TOLERANCE, 1 HOUR (50G) W/O FASTING: Glucose, 1 Hour GTT: 120 mg/dL (ref 70–140)

## 2013-09-15 LAB — RPR

## 2013-09-15 LAB — HIV ANTIBODY (ROUTINE TESTING W REFLEX): HIV: NONREACTIVE

## 2013-10-05 ENCOUNTER — Encounter: Payer: Self-pay | Admitting: Obstetrics & Gynecology

## 2013-10-05 ENCOUNTER — Ambulatory Visit (INDEPENDENT_AMBULATORY_CARE_PROVIDER_SITE_OTHER): Payer: Medicaid Other | Admitting: Obstetrics & Gynecology

## 2013-10-05 VITALS — BP 125/71 | HR 96 | Temp 98.0°F | Wt 181.0 lb

## 2013-10-05 DIAGNOSIS — Z8632 Personal history of gestational diabetes: Secondary | ICD-10-CM

## 2013-10-05 DIAGNOSIS — O34219 Maternal care for unspecified type scar from previous cesarean delivery: Secondary | ICD-10-CM

## 2013-10-05 DIAGNOSIS — O09299 Supervision of pregnancy with other poor reproductive or obstetric history, unspecified trimester: Secondary | ICD-10-CM

## 2013-10-05 DIAGNOSIS — O0992 Supervision of high risk pregnancy, unspecified, second trimester: Secondary | ICD-10-CM

## 2013-10-05 LAB — POCT URINALYSIS DIP (DEVICE)
Bilirubin Urine: NEGATIVE
Glucose, UA: NEGATIVE mg/dL
Hgb urine dipstick: NEGATIVE
KETONES UR: NEGATIVE mg/dL
Nitrite: NEGATIVE
PROTEIN: NEGATIVE mg/dL
Specific Gravity, Urine: 1.015 (ref 1.005–1.030)
UROBILINOGEN UA: 2 mg/dL — AB (ref 0.0–1.0)
pH: 6.5 (ref 5.0–8.0)

## 2013-10-05 NOTE — Progress Notes (Signed)
Pulse

## 2013-10-05 NOTE — Progress Notes (Signed)
Routine visit. Good FM. No problems.  

## 2013-10-19 ENCOUNTER — Ambulatory Visit (INDEPENDENT_AMBULATORY_CARE_PROVIDER_SITE_OTHER): Payer: Medicaid Other | Admitting: Obstetrics & Gynecology

## 2013-10-19 ENCOUNTER — Encounter: Payer: Self-pay | Admitting: Obstetrics & Gynecology

## 2013-10-19 VITALS — BP 127/73 | HR 88 | Wt 183.8 lb

## 2013-10-19 DIAGNOSIS — O09299 Supervision of pregnancy with other poor reproductive or obstetric history, unspecified trimester: Secondary | ICD-10-CM

## 2013-10-19 DIAGNOSIS — Z8632 Personal history of gestational diabetes: Principal | ICD-10-CM

## 2013-10-19 DIAGNOSIS — O34219 Maternal care for unspecified type scar from previous cesarean delivery: Secondary | ICD-10-CM

## 2013-10-19 DIAGNOSIS — O0992 Supervision of high risk pregnancy, unspecified, second trimester: Secondary | ICD-10-CM

## 2013-10-19 LAB — POCT URINALYSIS DIP (DEVICE)
BILIRUBIN URINE: NEGATIVE
GLUCOSE, UA: NEGATIVE mg/dL
Hgb urine dipstick: NEGATIVE
Ketones, ur: NEGATIVE mg/dL
Nitrite: NEGATIVE
Protein, ur: NEGATIVE mg/dL
SPECIFIC GRAVITY, URINE: 1.02 (ref 1.005–1.030)
Urobilinogen, UA: 0.2 mg/dL (ref 0.0–1.0)
pH: 6.5 (ref 5.0–8.0)

## 2013-10-19 NOTE — Progress Notes (Signed)
Routine visit. Good FM.

## 2013-10-19 NOTE — Progress Notes (Signed)
She is not speaking with the FOB and she never spoke with him about CF screening. Cultures at next visit. We discussed contraception and she is interested in Nexplanon. She declines a handout.

## 2013-10-31 ENCOUNTER — Inpatient Hospital Stay (HOSPITAL_COMMUNITY)
Admission: AD | Admit: 2013-10-31 | Discharge: 2013-10-31 | Disposition: A | Discharge status: Home / Self Care | Payer: Medicaid Other | Source: Ambulatory Visit | Attending: Obstetrics and Gynecology | Admitting: Obstetrics and Gynecology

## 2013-10-31 ENCOUNTER — Encounter (HOSPITAL_COMMUNITY): Payer: Self-pay | Admitting: *Deleted

## 2013-10-31 DIAGNOSIS — O4700 False labor before 37 completed weeks of gestation, unspecified trimester: Secondary | ICD-10-CM | POA: Insufficient documentation

## 2013-10-31 LAB — URINALYSIS, ROUTINE W REFLEX MICROSCOPIC
Bilirubin Urine: NEGATIVE
GLUCOSE, UA: NEGATIVE mg/dL
Hgb urine dipstick: NEGATIVE
Ketones, ur: NEGATIVE mg/dL
LEUKOCYTES UA: NEGATIVE
Nitrite: NEGATIVE
Protein, ur: NEGATIVE mg/dL
SPECIFIC GRAVITY, URINE: 1.02 (ref 1.005–1.030)
UROBILINOGEN UA: 2 mg/dL — AB (ref 0.0–1.0)
pH: 6 (ref 5.0–8.0)

## 2013-10-31 NOTE — Discharge Instructions (Signed)

## 2013-11-01 ENCOUNTER — Inpatient Hospital Stay (HOSPITAL_COMMUNITY)
Admission: AD | Admit: 2013-11-01 | Discharge: 2013-11-01 | Disposition: A | Discharge status: Home / Self Care | Payer: Medicaid Other | Source: Ambulatory Visit | Attending: Obstetrics & Gynecology | Admitting: Obstetrics & Gynecology

## 2013-11-01 ENCOUNTER — Inpatient Hospital Stay (HOSPITAL_COMMUNITY)
Admission: AD | Admit: 2013-11-01 | Discharge: 2013-11-03 | DRG: 775 | Disposition: A | Discharge status: Home / Self Care | Payer: Medicaid Other | Source: Ambulatory Visit | Attending: Obstetrics & Gynecology | Admitting: Obstetrics & Gynecology

## 2013-11-01 ENCOUNTER — Encounter (HOSPITAL_COMMUNITY): Payer: Self-pay

## 2013-11-01 ENCOUNTER — Encounter (HOSPITAL_COMMUNITY): Payer: Self-pay | Admitting: General Practice

## 2013-11-01 DIAGNOSIS — O99892 Other specified diseases and conditions complicating childbirth: Principal | ICD-10-CM | POA: Diagnosis present

## 2013-11-01 DIAGNOSIS — Z833 Family history of diabetes mellitus: Secondary | ICD-10-CM

## 2013-11-01 DIAGNOSIS — O9989 Other specified diseases and conditions complicating pregnancy, childbirth and the puerperium: Secondary | ICD-10-CM

## 2013-11-01 DIAGNOSIS — Z825 Family history of asthma and other chronic lower respiratory diseases: Secondary | ICD-10-CM

## 2013-11-01 LAB — CBC
HCT: 35.5 % — ABNORMAL LOW (ref 36.0–46.0)
HEMOGLOBIN: 12.8 g/dL (ref 12.0–15.0)
MCH: 29.6 pg (ref 26.0–34.0)
MCHC: 36.1 g/dL — ABNORMAL HIGH (ref 30.0–36.0)
MCV: 82.2 fL (ref 78.0–100.0)
Platelets: 182 10*3/uL (ref 150–400)
RBC: 4.32 MIL/uL (ref 3.87–5.11)
RDW: 14.9 % (ref 11.5–15.5)
WBC: 7.8 10*3/uL (ref 4.0–10.5)

## 2013-11-01 LAB — TYPE AND SCREEN
ABO/RH(D): A POS
Antibody Screen: NEGATIVE

## 2013-11-01 MED ORDER — DIBUCAINE 1 % RE OINT: 1.0000 "application " | TOPICAL_OINTMENT | RECTAL | Status: DC | PRN

## 2013-11-01 MED ORDER — ZOLPIDEM TARTRATE 5 MG PO TABS: 5.0000 mg | ORAL_TABLET | Freq: Every evening | ORAL | Status: DC | PRN

## 2013-11-01 MED ORDER — SENNOSIDES-DOCUSATE SODIUM 8.6-50 MG PO TABS
2.0000 | ORAL_TABLET | ORAL | Status: DC
  Administered 2013-11-02 – 2013-11-03 (×2): 2 via ORAL
  Filled 2013-11-01 (×2): qty 2

## 2013-11-01 MED ORDER — OXYCODONE-ACETAMINOPHEN 5-325 MG PO TABS
1.0000 | ORAL_TABLET | ORAL | Status: DC | PRN
  Administered 2013-11-01: 1 via ORAL
  Filled 2013-11-01: qty 2
  Filled 2013-11-01: qty 1

## 2013-11-01 MED ORDER — BENZOCAINE-MENTHOL 20-0.5 % EX AERO: 1.0000 "application " | INHALATION_SPRAY | CUTANEOUS | Status: DC | PRN

## 2013-11-01 MED ORDER — WITCH HAZEL-GLYCERIN EX PADS: 1.0000 "application " | MEDICATED_PAD | CUTANEOUS | Status: DC | PRN

## 2013-11-01 MED ORDER — LANOLIN HYDROUS EX OINT: TOPICAL_OINTMENT | CUTANEOUS | Status: DC | PRN

## 2013-11-01 MED ORDER — DIPHENHYDRAMINE HCL 25 MG PO CAPS: 25.0000 mg | ORAL_CAPSULE | Freq: Four times a day (QID) | ORAL | Status: DC | PRN

## 2013-11-01 MED ORDER — TETANUS-DIPHTH-ACELL PERTUSSIS 5-2.5-18.5 LF-MCG/0.5 IM SUSP: 0.5000 mL | Freq: Once | INTRAMUSCULAR | Status: DC

## 2013-11-01 MED ORDER — ONDANSETRON HCL 4 MG PO TABS: 4.0000 mg | ORAL_TABLET | ORAL | Status: DC | PRN

## 2013-11-01 MED ORDER — PRENATAL MULTIVITAMIN CH
1.0000 | ORAL_TABLET | Freq: Every day | ORAL | Status: DC
  Administered 2013-11-02 – 2013-11-03 (×2): 1 via ORAL
  Filled 2013-11-01 (×2): qty 1

## 2013-11-01 MED ORDER — IBUPROFEN 600 MG PO TABS
600.0000 mg | ORAL_TABLET | Freq: Four times a day (QID) | ORAL | Status: DC
  Administered 2013-11-01 – 2013-11-03 (×8): 600 mg via ORAL
  Filled 2013-11-01 (×7): qty 1

## 2013-11-01 MED ORDER — SIMETHICONE 80 MG PO CHEW: 80.0000 mg | CHEWABLE_TABLET | ORAL | Status: DC | PRN

## 2013-11-01 MED ORDER — ONDANSETRON HCL 4 MG/2ML IJ SOLN: 4.0000 mg | INTRAMUSCULAR | Status: DC | PRN

## 2013-11-01 NOTE — MAU Note (Signed)
Pt states has delivered all babies preterm. Here for contractions q7 minutes apart. Denies bleeding or lof.

## 2013-11-01 NOTE — H&P (Signed)
Paula Livingston is a 28 y.o. female [phone] with IUP at [redacted]w[redacted]d returned from labor check this evening was 8-9 and precipitously delivered viable female en route to L&D.    [facility] at [facility] since 21 wks  Prenatal History/Complications: No significant issues  Past Medical History: Past Medical History  Diagnosis Date  . Pregnancy induced hypertension 2007  . Preterm labor 2007  . Infection 2005    gonorrhea  . Infection 2005    CHLAMYDIA    Past Surgical History: Past Surgical History  Procedure Laterality Date  . Cesarean section      Obstetrical History: OB History   Grav Para Term Preterm Abortions TAB SAB Ect Mult Living   4 3 2 1  0 0 0 0 0 3     Obstetric Comments   2007-IOL FOR PREECLAMPSIA 2008 C/S FETAL DISTRESS      Gynecological History: OB History   Grav Para Term Preterm Abortions TAB SAB Ect Mult Living   4 3 2 1  0 0 0 0 0 3     Obstetric Comments   2007-IOL FOR PREECLAMPSIA 2008 C/S FETAL DISTRESS      Social History: History   Social History  . Marital Status: Single    Spouse Name: N/A    Number of Children: 2  . Years of Education: 15   Occupational History  . Working thru [employer]   Social History Main Topics  . Smoking status: Never Smoker   . Smokeless tobacco: Never Used  . Alcohol Use: No  . Drug Use: No  . Sexual Activity: Not Currently    Partners: Male    Birth Control/ Protection: None   Other Topics Concern  . None   Social History Narrative  . None    Family History: Family History  Problem Relation Age of Onset  . Other Neg Hx   . Alcohol abuse Mother   . Drug abuse Mother   . Aneurysm Father     BRAIN  . Alcohol abuse Sister   . Alcohol abuse Brother   . Asthma Son   . Birth defects Son     EXTRA DIGITS  . Seizures Son     FEBRILE  . Cancer Maternal Aunt 52    BREAST  . Cancer Maternal Uncle     PROSTATE  . Diabetes Maternal Uncle   . Alcohol abuse Maternal Uncle   . Drug abuse  Maternal Uncle   . Cancer Maternal Uncle     PROSTATE  . Alcohol abuse Maternal Uncle   . Drug abuse Maternal Uncle   . Cancer Maternal Uncle     BRAIN  . Alcohol abuse Maternal Uncle   . Drug abuse Maternal Uncle     Allergies: No Known Allergies  Facility-administered medications prior to admission  Medication Dose Route Frequency Provider Last Rate Last Dose  . Tdap (BOOSTRIX) injection 0.5 mL  0.5 mL Intramuscular Once Tereso Newcomer, MD       Prescriptions prior to admission  Medication Sig Dispense Refill  . Prenatal Vit-Fe Fumarate-FA (PRENATAL MULTIVITAMIN) TABS Take 1 tablet by mouth daily at 12 noon.         Review of Systems   Constitutional: no complaints PP  Blood pressure 135/78, pulse 84, temperature 98.7 F (37.1 C), temperature source Oral, resp. rate 18, last menstrual period 02/01/2013. General appearance: alert, cooperative, appears stated age and no distress Lungs: clear to auscultation bilaterally Heart: regular rate and  rhythm Abdomen: soft, non-tender; bowel sounds normal Pelvic: Not examined Extremities: Homans sign is negative, no sign of DVT Dilation: 9 Effacement (%): 100 Exam by:: sara beck rn   Prenatal labs: ABO, Rh: A/Positive/-- (02/02 0000) Antibody: Negative (02/02 0000) Rubella:   RPR: NON REAC (04/14 1126)  HBsAg: Negative (02/02 0000)  HIV: NONREACTIVE (04/14 1126)  GBS:     Clinic HR  Dating LMP is not accurate.  Dating is by 20 week US at MFM  Genetic Screen Quad neg  Anatomic US nml anatomy  GTT Early: 103           Third trimester: 120  TDaP vaccine 09/14/2013  Flu vaccine  given 07/22/13  GBS   Baby Food Bottle  Contraception Nexplanon  Circumcision Yes  Pediatrician [facility]  Support Person Sister, not involved with FOB      Prenatal Transfer Tool  Maternal Diabetes: No Genetic Screening: Normal Maternal Ultrasounds/Referrals: Normal Fetal Ultrasounds or other Referrals:  None Maternal Substance Abuse:   No Significant Maternal Medications:  None Significant Maternal Lab Results: Lab values include: Group B Strep negative     No results found for this or any previous visit (from the past 24 hour(s)).  Assessment: Paula Livingston is a 28 y.o. [phone] at 6930w3d by L=20 s/p precipitous delivery To postpartum, Routine orders  #MOF: bottle #MOC: mirena vs nexplanon #Circ:  outpatient  Minta BalsamMichael R Yadira Hada 11/01/2013, 7:23 PM

## 2013-11-01 NOTE — Discharge Instructions (Signed)

## 2013-11-01 NOTE — MAU Note (Signed)
Patient states she is having contractions every 7 minutes. Denies bleeding or leaking and reports good fetal movement.

## 2013-11-02 ENCOUNTER — Encounter: Payer: Medicaid Other | Admitting: Obstetrics and Gynecology

## 2013-11-02 ENCOUNTER — Encounter (HOSPITAL_COMMUNITY): Payer: Self-pay | Admitting: *Deleted

## 2013-11-02 LAB — CBC
HEMATOCRIT: 33.3 % — AB (ref 36.0–46.0)
HEMOGLOBIN: 11.9 g/dL — AB (ref 12.0–15.0)
MCH: 29.6 pg (ref 26.0–34.0)
MCHC: 35.7 g/dL (ref 30.0–36.0)
MCV: 82.8 fL (ref 78.0–100.0)
Platelets: 176 10*3/uL (ref 150–400)
RBC: 4.02 MIL/uL (ref 3.87–5.11)
RDW: 15.1 % (ref 11.5–15.5)
WBC: 10.1 10*3/uL (ref 4.0–10.5)

## 2013-11-02 LAB — RPR

## 2013-11-02 NOTE — Progress Notes (Signed)
Post Partum Day 1 Subjective: no complaints, up ad lib, voiding, tolerating PO and + flatus  Objective: Blood pressure 114/70, pulse 83, temperature 97.8 F (36.6 C), temperature source Oral, resp. rate 18, height 4\' 11"  (1.499 m), weight 86.183 kg (190 lb), last menstrual period 02/01/2013, unknown if currently breastfeeding.  Physical Exam:  General: alert, cooperative, appears stated age and no distress Lochia: appropriate Uterine Fundus: firm Incision: NA DVT Evaluation: No evidence of DVT seen on physical exam. Negative Homan's sign. No cords or calf tenderness. Moderate bilateral equal edema  Recent Labs  11/01/13 1951 11/02/13 0605  HGB 12.8 11.9*  HCT 35.5* 33.3*    Assessment/Plan: Plan for discharge tomorrow and Contraception mirena vs nexplanon Meeting milestones. No acute issues.   LOS: 1 day   Minta Balsam 11/02/2013, 7:32 AM

## 2013-11-03 MED ORDER — IBUPROFEN 600 MG PO TABS: 600.0000 mg | ORAL_TABLET | Freq: Four times a day (QID) | ORAL | Status: DC | PRN

## 2013-11-03 NOTE — Progress Notes (Signed)
Ur chart review completed.  

## 2013-11-03 NOTE — Discharge Summary (Signed)
Obstetric Discharge Summary Reason for Admission: onset of labor Prenatal Procedures: ultrasound Intrapartum Procedures: spontaneous vaginal delivery Postpartum Procedures: none Complications-Operative and Postpartum: none Hemoglobin  Date Value Ref Range Status  11/02/2013 11.9* 12.0 - 15.0 g/dL Final  0/12/6224 33.3   Final     HCT  Date Value Ref Range Status  11/02/2013 33.3* 36.0 - 46.0 % Final  07/05/2013 36   Final    Physical Exam:  General: alert, cooperative, appears stated age and no distress Lochia: appropriate Uterine Fundus: firm Incision: NA DVT Evaluation: No evidence of DVT seen on physical exam. No significant calf/ankle edema.  Discharge Diagnoses: Preterm delivery  Discharge Information: Date: 11/03/2013 Activity: pelvic rest Diet: routine Medications: PNV and Ibuprofen Condition: stable Instructions: refer to practice specific booklet Discharge to: home Follow-up Information   Follow up with [facility] In 6 weeks.   Specialty:  Obstetrics and Gynecology   Contact information:   [address] [place] [address] [phone]      Follow up with [facility] [place] MATERNITY ADMISSIONS. (As needed in emergencies)    Contact information:   [address] [id number] [place] [address] [phone]      Newborn Data: Live born female  Birth Weight: 5 lb 11.5 oz (2594 g) APGAR: 9, 9  Home with mother. #MOF: bottle  #MOC: mirena vs nexplanon  #Circ: outpatient  [place] 11/03/2013, 9:17 AM

## 2013-11-03 NOTE — Discharge Instructions (Signed)

## 2013-12-10 ENCOUNTER — Encounter: Payer: Self-pay | Admitting: Obstetrics & Gynecology

## 2013-12-10 ENCOUNTER — Ambulatory Visit (INDEPENDENT_AMBULATORY_CARE_PROVIDER_SITE_OTHER): Payer: Medicaid Other | Admitting: Obstetrics & Gynecology

## 2013-12-10 VITALS — BP 124/86 | HR 79 | Temp 97.5°F | Ht 59.0 in | Wt 165.2 lb

## 2013-12-10 DIAGNOSIS — Z30017 Encounter for initial prescription of implantable subdermal contraceptive: Secondary | ICD-10-CM

## 2013-12-10 DIAGNOSIS — Z01812 Encounter for preprocedural laboratory examination: Secondary | ICD-10-CM

## 2013-12-10 LAB — POCT PREGNANCY, URINE: Preg Test, Ur: NEGATIVE

## 2013-12-10 MED ORDER — ETONOGESTREL 68 MG ~~LOC~~ IMPL
68.0000 mg | DRUG_IMPLANT | Freq: Once | SUBCUTANEOUS | Status: AC
  Administered 2013-12-10: 68 mg via SUBCUTANEOUS

## 2013-12-10 NOTE — Progress Notes (Signed)
   Subjective:    Patient ID: Paula Livingston, female    DOB: [birth year], 28 y.o.   MRN: [id number]  HPI  28 yo AA P4 who is here for her PP exam and Nexplanon insertion. She has no complaints. Baby is growing well with bottle feeding. Her milk has dried up. She has not had sex yet. She reports normal bowel and bladder function. She is aware that irregular bleeding is a side effect of Nexplanon and she promises that this will not bother her.  Review of Systems     Objective:   Physical Exam  UPT was negative. Consent was signed. Time out procedure was done. Her left arm was prepped with betadine and infiltrated with 3 cc of 1% lidocaine. After adequate anesthesia was assured, the Nexplanon device was placed according to standard of care. Her arm was hemostatic and was bandaged. She tolerated the procedure well.       Assessment & Plan:   PP- doing well Contraception- Nexplanon Preventative- RTC 6 months for annual and pap (She has a h/[facility] abnormal paps at [facility])

## 2013-12-10 NOTE — Progress Notes (Signed)
Patient here today for PP visit. Denies any sexual activity since delivery. Would like nexplanon as MOC. UPT obtained.

## 2013-12-15 ENCOUNTER — Encounter: Payer: Self-pay | Admitting: *Deleted

## 2014-04-04 ENCOUNTER — Encounter: Payer: Self-pay | Admitting: Obstetrics & Gynecology

## 2014-05-17 ENCOUNTER — Encounter: Payer: Self-pay | Admitting: *Deleted

## 2015-04-05 ENCOUNTER — Encounter (HOSPITAL_COMMUNITY): Payer: Self-pay | Admitting: *Deleted

## 2015-04-05 ENCOUNTER — Inpatient Hospital Stay (HOSPITAL_COMMUNITY)
Admission: AD | Admit: 2015-04-05 | Discharge: 2015-04-05 | Disposition: A | Discharge status: Home / Self Care | Payer: Self-pay | Source: Ambulatory Visit | Attending: Obstetrics and Gynecology | Admitting: Obstetrics and Gynecology

## 2015-04-05 DIAGNOSIS — Z3202 Encounter for pregnancy test, result negative: Secondary | ICD-10-CM | POA: Insufficient documentation

## 2015-04-05 DIAGNOSIS — J111 Influenza due to unidentified influenza virus with other respiratory manifestations: Secondary | ICD-10-CM

## 2015-04-05 DIAGNOSIS — R51 Headache: Secondary | ICD-10-CM | POA: Insufficient documentation

## 2015-04-05 DIAGNOSIS — R509 Fever, unspecified: Secondary | ICD-10-CM | POA: Insufficient documentation

## 2015-04-05 DIAGNOSIS — R42 Dizziness and giddiness: Secondary | ICD-10-CM | POA: Insufficient documentation

## 2015-04-05 DIAGNOSIS — R531 Weakness: Secondary | ICD-10-CM | POA: Insufficient documentation

## 2015-04-05 LAB — URINALYSIS, ROUTINE W REFLEX MICROSCOPIC
BILIRUBIN URINE: NEGATIVE
Glucose, UA: NEGATIVE mg/dL
Ketones, ur: NEGATIVE mg/dL
Nitrite: NEGATIVE
PROTEIN: NEGATIVE mg/dL
Specific Gravity, Urine: 1.01 (ref 1.005–1.030)
UROBILINOGEN UA: 2 mg/dL — AB (ref 0.0–1.0)
pH: 7 (ref 5.0–8.0)

## 2015-04-05 LAB — URINE MICROSCOPIC-ADD ON

## 2015-04-05 LAB — POCT PREGNANCY, URINE: Preg Test, Ur: NEGATIVE

## 2015-04-05 LAB — GLUCOSE, CAPILLARY: GLUCOSE-CAPILLARY: 93 mg/dL (ref 65–99)

## 2015-04-05 MED ORDER — ACETAMINOPHEN 500 MG PO TABS
1000.0000 mg | ORAL_TABLET | Freq: Once | ORAL | Status: AC
  Administered 2015-04-05: 1000 mg via ORAL
  Filled 2015-04-05: qty 2

## 2015-04-05 NOTE — MAU Note (Signed)
Pt reports she was at work and because the had been feeling dizzy since this am they checked her CBG and it was 360. States she has no history of diabetes except for with pregnancy. Also reports a headache.

## 2015-04-05 NOTE — Discharge Instructions (Signed)

## 2015-04-05 NOTE — MAU Provider Note (Signed)
History     CSN: [id number]  Arrival date and time: 04/05/15 2211   First Provider Initiated Contact with Patient 04/05/15 2258      No chief complaint on file.  HPI Comments: Paula Livingston is a 29 y.o. [phone] who presents today with body aches, fever and dizziness. She states at work today they checked a CBG and it was >300. 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   Influenza This is a new problem. The current episode started today. The problem occurs constantly. The problem has been gradually worsening. Associated symptoms include chills, a fever, headaches, myalgias and weakness. Pertinent negatives include no congestion, coughing, nausea, sore throat, urinary symptoms or vomiting. Nothing aggravates the symptoms. She has tried nothing for the symptoms. The treatment provided no relief.    Past Medical History  Diagnosis Date  . Pregnancy induced hypertension 2007  . Preterm labor 2007  . Infection 2005    gonorrhea  . Infection 2005    CHLAMYDIA    Past Surgical History  Procedure Laterality Date  . Cesarean section      Family History  Problem Relation Age of Onset  . Other Neg Hx   . Alcohol abuse Mother   . Drug abuse Mother   . Aneurysm Father     BRAIN  . Alcohol abuse Sister   . Alcohol abuse Brother   . Asthma Son   . Birth defects Son     EXTRA DIGITS  . Seizures Son     FEBRILE  . Cancer Maternal Aunt 52    BREAST  . Cancer Maternal Uncle     PROSTATE  . Diabetes Maternal Uncle   . Alcohol abuse Maternal Uncle   . Drug abuse Maternal Uncle   . Cancer Maternal Uncle     PROSTATE  . Alcohol abuse Maternal Uncle   . Drug abuse Maternal Uncle   . Cancer Maternal Uncle     BRAIN  . Alcohol abuse Maternal Uncle   . Drug abuse Maternal Uncle     Social History  Substance Use Topics  . Smoking status: Never Smoker   . Smokeless tobacco: Never Used  . Alcohol Use: No    Allergies: No Known Allergies  Prescriptions prior to admission  Medication Sig  Dispense Refill Last Dose  . ibuprofen (ADVIL,MOTRIN) 600 MG tablet Take 1 tablet (600 mg total) by mouth every 6 (six) hours as needed. 30 tablet 1 Taking  . Prenatal Vit-Fe Fumarate-FA (PRENATAL MULTIVITAMIN) TABS tablet Take 1 tablet by mouth daily at 12 noon.   Taking    Review of Systems  Constitutional: Positive for fever and chills.  HENT: Negative for congestion and sore throat.   Respiratory: Negative for cough.   Gastrointestinal: Negative for nausea and vomiting.  Musculoskeletal: Positive for myalgias.  Neurological: Positive for dizziness, weakness and headaches.   Physical Exam   Blood pressure 135/87, pulse 122, temperature 103.1 F (39.5 C), temperature source Oral, resp. rate 18, last menstrual period 03/29/2015, SpO2 99 %, not currently breastfeeding.  Physical Exam  Nursing note and vitals reviewed. Constitutional: She is oriented to person, place, and time. She appears well-developed and well-nourished.  HENT:  Head: Normocephalic.  GI: Soft. There is no tenderness.  Neurological: She is alert and oriented to person, place, and time.  Skin: Skin is warm and dry.  Psychiatric: She has a normal mood and affect.   Results for orders placed or performed during the hospital encounter of 04/05/15 (  from the past 24 hour(s))  Urinalysis, Routine w reflex microscopic (not at [facility])     Status: Abnormal   Collection Time: 04/05/15 10:20 PM  Result Value Ref Range   Color, Urine YELLOW YELLOW   APPearance CLEAR CLEAR   Specific Gravity, Urine 1.010 1.005 - 1.030   pH 7.0 5.0 - 8.0   Glucose, UA NEGATIVE NEGATIVE mg/dL   Hgb urine dipstick LARGE (A) NEGATIVE   Bilirubin Urine NEGATIVE NEGATIVE   Ketones, ur NEGATIVE NEGATIVE mg/dL   Protein, ur NEGATIVE NEGATIVE mg/dL   Urobilinogen, UA 2.0 (H) 0.0 - 1.0 mg/dL   Nitrite NEGATIVE NEGATIVE   Leukocytes, UA SMALL (A) NEGATIVE  Urine microscopic-add on     Status: Abnormal   Collection Time: 04/05/15 10:20 PM  Result  Value Ref Range   Squamous Epithelial / LPF RARE RARE   WBC, UA 3-6 <3 WBC/hpf   RBC / HPF 0-2 <3 RBC/hpf   Bacteria, UA FEW (A) RARE  Pregnancy, urine POC     Status: None   Collection Time: 04/05/15 10:30 PM  Result Value Ref Range   Preg Test, Ur NEGATIVE NEGATIVE  Glucose, capillary     Status: None   Collection Time: 04/05/15 10:36 PM  Result Value Ref Range   Glucose-Capillary 93 65 - 99 mg/dL  ; MAU Course  Procedures  MDM Blood glucose normal here.   Assessment and Plan  Likely influenza Influenza PCR pending Does not meet requirements for treatment with Tamiflu Comfort measures reviewed   Follow-up Information    [facility]   Why:  If symptoms worsen   Contact information:   [address] [place] [id number] [phone]        Tawnya CrookHogan, Anijah Spohr Donovan 04/05/2015, 11:00 PM

## 2015-04-07 LAB — INFLUENZA VIRUS AG, A+B (DFA)

## 2017-02-04 ENCOUNTER — Inpatient Hospital Stay (HOSPITAL_COMMUNITY)
Admission: AD | Admit: 2017-02-04 | Discharge: 2017-02-04 | Disposition: A | Discharge status: Home / Self Care | Payer: Medicaid Other | Source: Ambulatory Visit | Attending: Family Medicine | Admitting: Family Medicine

## 2017-02-04 DIAGNOSIS — N912 Amenorrhea, unspecified: Secondary | ICD-10-CM

## 2017-02-04 NOTE — MAU Provider Note (Signed)
[name] is a 31 y.o. [phone] who present to MAU today for pregnancy confirmation. She denies abdominal pain or vaginal bleeding.   BP 134/70 (BP Location: Right Arm)   Pulse 87   Temp 99 F (37.2 C) (Oral)   Resp 17   Ht 5' (1.524 m)   Wt 83.9 kg (185 lb)   LMP 12/30/2016   SpO2 100%   BMI 36.13 kg/m  CONSTITUTIONAL: Well-developed, well-nourished female in no acute distress.  CARDIOVASCULAR: Normal heart rate noted RESPIRATORY: Effort and breath sounds normal GASTROINTESTINAL:Soft, no distention noted.  No tenderness, rebound or guarding.  SKIN: Skin is warm and dry. No rash noted. Not diaphoretic. No erythema. No pallor. PSYCHIATRIC: Normal mood and affect. Normal behavior. Normal judgment and thought content.  MDM Medical screening exam complete Patient does not endorse any symptoms concerning for ectopic pregnancy or pregnancy related complication today.   A:  Amenorrhea  P: Discharge home Patient advised that she can present as a walk-in to [facility] for a pregnancy test M-Th between 8am-4pm or Friday between 8am -11am Reasons to return to MAU reviewed  Patient may return to MAU as needed or if her condition were to change or worsen  Raelyn MoraDawson, Roshunda Keir, CNM 02/04/2017 7:27 PM

## 2017-02-04 NOTE — MAU Note (Signed)
Pt states she missed her period and had +upt at home on Thursday. Pt here for confirmation of pregnancy. Pt denies pain or vaginal bleeding. LMP: 12/30/2016.

## 2017-02-06 ENCOUNTER — Encounter: Payer: Self-pay | Admitting: Obstetrics & Gynecology

## 2017-02-06 ENCOUNTER — Ambulatory Visit (INDEPENDENT_AMBULATORY_CARE_PROVIDER_SITE_OTHER): Payer: Self-pay

## 2017-02-06 DIAGNOSIS — Z3201 Encounter for pregnancy test, result positive: Secondary | ICD-10-CM

## 2017-02-06 LAB — POCT PREGNANCY, URINE: Preg Test, Ur: POSITIVE — AB

## 2017-02-06 NOTE — Progress Notes (Signed)
Patient presented to the office for a UPT. UPT positive. Patient is 5w 3d. Advised patient to start prenatal vitamins and prenatal care.

## 2017-02-13 ENCOUNTER — Ambulatory Visit: Payer: Self-pay

## 2017-02-21 ENCOUNTER — Inpatient Hospital Stay (HOSPITAL_COMMUNITY): Payer: Medicaid Other

## 2017-02-21 ENCOUNTER — Encounter: Payer: Self-pay | Admitting: Student

## 2017-02-21 ENCOUNTER — Inpatient Hospital Stay (HOSPITAL_COMMUNITY)
Admission: AD | Admit: 2017-02-21 | Discharge: 2017-02-22 | Disposition: A | Discharge status: Home / Self Care | Payer: Medicaid Other | Source: Ambulatory Visit | Attending: Obstetrics and Gynecology | Admitting: Obstetrics and Gynecology

## 2017-02-21 DIAGNOSIS — B9689 Other specified bacterial agents as the cause of diseases classified elsewhere: Secondary | ICD-10-CM | POA: Insufficient documentation

## 2017-02-21 DIAGNOSIS — O208 Other hemorrhage in early pregnancy: Secondary | ICD-10-CM | POA: Diagnosis not present

## 2017-02-21 DIAGNOSIS — O23591 Infection of other part of genital tract in pregnancy, first trimester: Secondary | ICD-10-CM | POA: Diagnosis not present

## 2017-02-21 DIAGNOSIS — N760 Acute vaginitis: Secondary | ICD-10-CM

## 2017-02-21 DIAGNOSIS — O209 Hemorrhage in early pregnancy, unspecified: Secondary | ICD-10-CM | POA: Diagnosis present

## 2017-02-21 DIAGNOSIS — Z3A01 Less than 8 weeks gestation of pregnancy: Secondary | ICD-10-CM | POA: Diagnosis not present

## 2017-02-21 DIAGNOSIS — Z3491 Encounter for supervision of normal pregnancy, unspecified, first trimester: Secondary | ICD-10-CM

## 2017-02-21 LAB — COMPREHENSIVE METABOLIC PANEL
ALBUMIN: 3.6 g/dL (ref 3.5–5.0)
ALT: 12 U/L — ABNORMAL LOW (ref 14–54)
AST: 15 U/L (ref 15–41)
Alkaline Phosphatase: 30 U/L — ABNORMAL LOW (ref 38–126)
Anion gap: 7 (ref 5–15)
BILIRUBIN TOTAL: 0.4 mg/dL (ref 0.3–1.2)
BUN: 10 mg/dL (ref 6–20)
CO2: 22 mmol/L (ref 22–32)
Calcium: 9.2 mg/dL (ref 8.9–10.3)
Chloride: 106 mmol/L (ref 101–111)
Creatinine, Ser: 0.62 mg/dL (ref 0.44–1.00)
GFR calc Af Amer: >60 mL/min (ref >60)
GFR calc non Af Amer: >60 mL/min (ref >60)
GLUCOSE: 104 mg/dL — AB (ref 65–99)
Potassium: 3.7 mmol/L (ref 3.5–5.1)
SODIUM: 135 mmol/L (ref 135–145)
TOTAL PROTEIN: 7.5 g/dL (ref 6.5–8.1)

## 2017-02-21 LAB — URINALYSIS, DIPSTICK ONLY
Bilirubin Urine: NEGATIVE
Glucose, UA: NEGATIVE mg/dL
KETONES UR: NEGATIVE mg/dL
Nitrite: NEGATIVE
PROTEIN: NEGATIVE mg/dL
Specific Gravity, Urine: 1.015 (ref 1.005–1.030)
pH: 7 (ref 5.0–8.0)

## 2017-02-21 LAB — CBC
HEMATOCRIT: 34.6 % — AB (ref 36.0–46.0)
HEMOGLOBIN: 12.5 g/dL (ref 12.0–15.0)
MCH: 29.8 pg (ref 26.0–34.0)
MCHC: 36.1 g/dL — ABNORMAL HIGH (ref 30.0–36.0)
MCV: 82.4 fL (ref 78.0–100.0)
Platelets: 232 10*3/uL (ref 150–400)
RBC: 4.2 MIL/uL (ref 3.87–5.11)
RDW: 14.1 % (ref 11.5–15.5)
WBC: 8.5 10*3/uL (ref 4.0–10.5)

## 2017-02-21 NOTE — MAU Note (Signed)
I saw pink spotting tonight when I wiped. Some discomfort in lower back.

## 2017-02-22 ENCOUNTER — Encounter (HOSPITAL_COMMUNITY): Payer: Self-pay | Admitting: *Deleted

## 2017-02-22 DIAGNOSIS — O208 Other hemorrhage in early pregnancy: Secondary | ICD-10-CM

## 2017-02-22 LAB — WET PREP, GENITAL
Sperm: NONE SEEN
Trich, Wet Prep: NONE SEEN
Yeast Wet Prep HPF POC: NONE SEEN

## 2017-02-22 LAB — HIV ANTIBODY (ROUTINE TESTING W REFLEX): HIV SCREEN 4TH GENERATION: NONREACTIVE

## 2017-02-22 LAB — RPR: RPR Ser Ql: NONREACTIVE

## 2017-02-22 MED ORDER — METRONIDAZOLE 500 MG PO TABS: 500.0000 mg | ORAL_TABLET | Freq: Two times a day (BID) | ORAL | 0 refills | Status: DC

## 2017-02-22 NOTE — MAU Provider Note (Signed)
History     CSN: [id number]  Arrival date and time: 02/21/17 2017  First Provider Initiated Contact with Patient 02/22/17 0038      Chief Complaint  Patient presents with  . Vaginal Discharge   HPI  Paula Livingston is a 31 y.o. [id number] at [redacted]w[redacted]d who presents with vaginal bleeding. Reports episodes of pink spotting on toilet paper this evening. Denies n/v/d, constipation, dysuria, abdominal pain. No recent intercourse. Has voided twice since being in MAU & has no longer seen blood.   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 Infection 2005   gonorrhea  . Infection 2005   CHLAMYDIA  . Pregnancy induced hypertension 2007  . Preterm labor 2007    Past Surgical History:  Procedure Laterality Date  . CESAREAN SECTION      Family History  Problem Relation Age of Onset  . Alcohol abuse Mother   . Drug abuse Mother   . Aneurysm Father        BRAIN  . Cancer Maternal Aunt 52       BREAST  . Cancer Maternal Uncle        PROSTATE  . Diabetes Maternal Uncle   . Alcohol abuse Maternal Uncle   . Drug abuse Maternal Uncle   . Cancer Maternal Uncle        PROSTATE  . Alcohol abuse Maternal Uncle   . Drug abuse Maternal Uncle   . Cancer Maternal Uncle        BRAIN  . Alcohol abuse Maternal Uncle   . Drug abuse Maternal Uncle   . Alcohol abuse Sister   . Alcohol abuse Brother   . Asthma Son   . Birth defects Son        EXTRA DIGITS  . Seizures Son        FEBRILE  . Other Neg Hx     Social History  Substance Use Topics  . Smoking status: Never Smoker  . Smokeless tobacco: Never Used  . Alcohol use No    Allergies: No Known Allergies  Prescriptions Prior to Admission  Medication Sig Dispense Refill Last Dose  . Prenatal Vit-Fe Fumarate-FA (PRENATAL MULTIVITAMIN) TABS  tablet Take 1 tablet by mouth daily at 12 noon.   02/21/2017 at Unknown time  . ibuprofen (ADVIL,MOTRIN) 600 MG tablet Take 1 tablet (600 mg total) by mouth every 6 (six) hours as needed. (Patient not taking: Reported on 02/06/2017) 30 tablet 1 More than a month at Unknown time    Review of Systems  Constitutional: Negative.   Gastrointestinal: Negative.  Negative for abdominal pain.  Genitourinary: Positive for vaginal bleeding. Negative for dysuria and vaginal discharge.   Physical Exam   Blood pressure 131/75, pulse 79, temperature 98.3 F (36.8 C), temperature source Oral, resp. rate 18, height  (1.499 m), weight 191 lb (86.6 kg), last menstrual period 12/30/2016, SpO2 100 %.  Physical Exam  Nursing note and vitals reviewed. Constitutional: She is oriented to person, place, and time. She appears well-developed and well-nourished. No distress.  HENT:  Head: Normocephalic and atraumatic.  Eyes: Conjunctivae are normal. Right eye exhibits no discharge. Left eye exhibits no discharge. No  scleral icterus.  Neck: Normal range of motion.  Respiratory: Effort normal. No respiratory distress.  GI: Soft. There is no tenderness.  Genitourinary: Cervix exhibits no motion tenderness and no friability. There is bleeding (small streak of dark red blood in mucous; no active bleeding) in the vagina. Vaginal discharge found.  Genitourinary Comments: Cervix closed  Neurological: She is alert and oriented to person, place, and time.  Skin: Skin is warm and dry. She is not diaphoretic.  Psychiatric: She has a normal mood and affect. Her behavior is normal. Judgment and thought content normal.    MAU Course  Procedures Results for orders placed or performed during the hospital encounter of 02/21/17 (from the past 24 hour(s))  Urinalysis, dipstick only     Status: Abnormal   Collection Time: 02/21/17  8:40 PM  Result Value Ref Range   Color, Urine YELLOW YELLOW   APPearance CLOUDY (A) CLEAR    Specific Gravity, Urine 1.015 1.005 - 1.030   pH 7.0 5.0 - 8.0   Glucose, UA NEGATIVE NEGATIVE mg/dL   Hgb urine dipstick LARGE (A) NEGATIVE   Bilirubin Urine NEGATIVE NEGATIVE   Ketones, ur NEGATIVE NEGATIVE mg/dL   Protein, ur NEGATIVE NEGATIVE mg/dL   Nitrite NEGATIVE NEGATIVE   Leukocytes, UA SMALL (A) NEGATIVE  CBC     Status: Abnormal   Collection Time: 02/21/17  9:03 PM  Result Value Ref Range   WBC 8.5 4.0 - 10.5 K/uL   RBC 4.20 3.87 - 5.11 MIL/uL   Hemoglobin 12.5 12.0 - 15.0 g/dL   HCT 16.1 (L) 09.6 - 04.5 %   MCV 82.4 78.0 - 100.0 fL   MCH 29.8 26.0 - 34.0 pg   MCHC 36.1 (H) 30.0 - 36.0 g/dL   RDW 40.9 81.1 - 91.4 %   Platelets 232 150 - 400 K/uL  Comprehensive metabolic panel     Status: Abnormal   Collection Time: 02/21/17  9:03 PM  Result Value Ref Range   Sodium 135 135 - 145 mmol/L   Potassium 3.7 3.5 - 5.1 mmol/L   Chloride 106 101 - 111 mmol/L   CO2 22 22 - 32 mmol/L   Glucose, Bld 104 (H) 65 - 99 mg/dL   BUN 10 6 - 20 mg/dL   Creatinine, Ser 7.82 0.44 - 1.00 mg/dL   Calcium 9.2 8.9 - 95.6 mg/dL   Total Protein 7.5 6.5 - 8.1 g/dL   Albumin 3.6 3.5 - 5.0 g/dL   AST 15 15 - 41 U/L   ALT 12 (L) 14 - 54 U/L   Alkaline Phosphatase 30 (L) 38 - 126 U/L   Total Bilirubin 0.4 0.3 - 1.2 mg/dL   GFR calc non Af Amer >60 >60 mL/min   GFR calc Af Amer >60 >60 mL/min   Anion gap 7 5 - 15  Wet prep, genital     Status: Abnormal   Collection Time: 02/22/17 12:45 AM  Result Value Ref Range   Yeast Wet Prep HPF POC NONE SEEN NONE SEEN   Trich, Wet Prep NONE SEEN NONE SEEN   Clue Cells Wet Prep HPF POC PRESENT (A) NONE SEEN   WBC, Wet Prep HPF POC MODERATE (A) NONE SEEN   Sperm NONE SEEN    US Ob Comp Less 14 Wks  Result Date: 02/21/2017 CLINICAL DATA:  Vaginal bleeding during first trimester pregnancy. Estimated gestational age by LMP is 7 weeks 4 days. Quantitative beta HCG was not obtained. EXAM: OBSTETRIC <14 WK [place] AND  TRANSVAGINAL OB US TECHNIQUE: Both  transabdominal and transvaginal ultrasound examinations were performed for complete evaluation of the gestation as well as the maternal uterus, adnexal regions, and pelvic cul-de-sac. Transvaginal technique was performed to assess early pregnancy. COMPARISON:  None. FINDINGS: Intrauterine gestational sac: A single intrauterine gestational sac is present. Yolk sac:  Yolk sac is visualized. Embryo:  Fetal pole is identified. Cardiac Activity: Fetal cardiac activity is observed. Heart Rate: 157  bpm CRL:  13.9  mm   7 w   4 d                  [place] EDC: 10/06/2017 Subchorionic hemorrhage:  None visualized. Maternal uterus/adnexae: Uterus is anteverted. No myometrial mass lesion is identified. Small nabothian cysts in the cervix. Both ovaries are visualized and appear unremarkable. No abnormal adnexal masses. Small corpus luteal cyst on the right. No free fluid in the abdomen. IMPRESSION: Single intrauterine pregnancy. Estimated gestational age by crown-rump length is 7 weeks 4 days. No acute complication demonstrated sonographically. Electronically Signed   By: Burman Nieves M.D.   On: 02/21/2017 22:46   US Ob Transvaginal  Result Date: 02/21/2017 CLINICAL DATA:  Vaginal bleeding during first trimester pregnancy. Estimated gestational age by LMP is 7 weeks 4 days. Quantitative beta HCG was not obtained. EXAM: OBSTETRIC <14 WK [place] AND TRANSVAGINAL OB US TECHNIQUE: Both transabdominal and transvaginal ultrasound examinations were performed for complete evaluation of the gestation as well as the maternal uterus, adnexal regions, and pelvic cul-de-sac. Transvaginal technique was performed to assess early pregnancy. COMPARISON:  None. FINDINGS: Intrauterine gestational sac: A single intrauterine gestational sac is present. Yolk sac:  Yolk sac is visualized. Embryo:  Fetal pole is identified. Cardiac Activity: Fetal cardiac activity is observed. Heart Rate: 157  bpm CRL:  13.9  mm   7 w   4 d                  [place] EDC:  10/06/2017 Subchorionic hemorrhage:  None visualized. Maternal uterus/adnexae: Uterus is anteverted. No myometrial mass lesion is identified. Small nabothian cysts in the cervix. Both ovaries are visualized and appear unremarkable. No abnormal adnexal masses. Small corpus luteal cyst on the right. No free fluid in the abdomen. IMPRESSION: Single intrauterine pregnancy. Estimated gestational age by crown-rump length is 7 weeks 4 days. No acute complication demonstrated sonographically. Electronically Signed   By: Burman Nieves M.D.   On: 02/21/2017 22:46    MDM +UPT UA, wet prep, GC/chlamydia, CBC, ABO/Rh, quant hCG, HIV, and [place] today to rule out ectopic pregnancy A positive Ultrasound shows  SIUP with cardiac activity Minimal blood on exam. Cervix closed/thick   Assessment and Plan  A: 1. Normal IUP (intrauterine pregnancy) on prenatal ultrasound, first trimester   2. [redacted] weeks gestation of pregnancy   3. Vaginal bleeding affecting early pregnancy   4. BV (bacterial vaginosis)    P: Discharge home Rx flagyl Discussed reasons to return to MAU Start prenatal care  GC/CT pending   Judeth Horn 02/22/2017, 12:38 AM

## 2017-02-22 NOTE — Discharge Instructions (Signed)
Bacterial Vaginosis Bacterial vaginosis is an infection of the vagina. It happens when too many germs (bacteria) grow in the vagina. This infection puts you at risk for infections from sex (STIs). Treating this infection can lower your risk for some STIs. You should also treat this if you are pregnant. It can cause your baby to be born early. Follow these instructions at home: Medicines  Take over-the-counter and prescription medicines only as told by your doctor.  Take or use your antibiotic medicine as told by your doctor. Do not stop taking or using it even if you start to feel better. General instructions  If you your sexual partner is a woman, tell her that you have this infection. She needs to get treatment if she has symptoms. If you have a female partner, he does not need to be treated.  During treatment: ? Avoid sex. ? Do not douche. ? Avoid alcohol as told. ? Avoid breastfeeding as told.  Drink enough fluid to keep your pee (urine) clear or pale yellow.  Keep your vagina and butt (rectum) clean. ? Wash the area with warm water every day. ? Wipe from front to back after you use the toilet.  Keep all follow-up visits as told by your doctor. This is important. Preventing this condition  Do not douche.  Use only warm water to wash around your vagina.  Use protection when you have sex. This includes: ? Latex condoms. ? Dental dams.  Limit how many people you have sex with. It is best to only have sex with the same person (be monogamous).  Get tested for STIs. Have your partner get tested.  Wear underwear that is cotton or lined with cotton.  Avoid tight pants and pantyhose. This is most important in summer.  Do not use any products that have nicotine or tobacco in them. These include cigarettes and e-cigarettes. If you need help quitting, ask your doctor.  Do not use illegal drugs.  Limit how much alcohol you drink. Contact a doctor if:  Your symptoms do not get  better, even after you are treated.  You have more discharge or pain when you pee (urinate).  You have a fever.  You have pain in your belly (abdomen).  You have pain with sex.  Your bleed from your vagina between periods. Summary  This infection happens when too many germs (bacteria) grow in the vagina.  Treating this condition can lower your risk for some infections from sex (STIs).  You should also treat this if you are pregnant. It can cause early (premature) birth.  Do not stop taking or using your antibiotic medicine even if you start to feel better. This information is not intended to replace advice given to you by your health care provider. Make sure you discuss any questions you have with your health care provider. Document Released: 02/27/2008 Document Revised: 02/03/2016 Document Reviewed: 02/03/2016 Elsevier Interactive Patient Education  2017 Else[employer].     [place] [facility] Providers    [facility] [employer] at [facility]       Phone: [phone]  [facility] [employer] at [place] Phone: [phone]  [facility] [employer] at [place]  Phone: [phone]  Center for [facility] at [facility]  Phone: [phone]  Center for [facility] at [place]  Phone: [phone]  [facility]       Phone: [phone]  [facility]    Phone: [phone]   [facility] (  [place])    Phone: [phone]  [facility]    Phone: [phone]  [facility]                                     Phone: [phone]  [facility]    Phone: [phone]  [facility]    Phone: [phone]  [facility]       Phone: [phone]   [facility]  Phone: [phone]  [facility]    Phone:  [phone]  [facility]   Phone: [phone]  [facility]      Phone: [phone]  [facility]    Phone: [phone]

## 2017-02-24 LAB — CULTURE, OB URINE: Culture: 1000 — AB

## 2017-02-24 LAB — GC/CHLAMYDIA PROBE AMP (~~LOC~~) NOT AT ARMC
CHLAMYDIA, DNA PROBE: NEGATIVE
Neisseria Gonorrhea: NEGATIVE

## 2017-03-24 LAB — OB RESULTS CONSOLE GBS: GBS: POSITIVE

## 2017-03-25 LAB — OB RESULTS CONSOLE HEPATITIS B SURFACE ANTIGEN: Hepatitis B Surface Ag: NEGATIVE

## 2017-03-25 LAB — OB RESULTS CONSOLE RUBELLA ANTIBODY, IGM: Rubella: IMMUNE

## 2017-03-25 LAB — OB RESULTS CONSOLE ANTIBODY SCREEN: ANTIBODY SCREEN: NEGATIVE

## 2017-03-25 LAB — OB RESULTS CONSOLE ABO/RH: RH Type: POSITIVE

## 2017-03-25 LAB — OB RESULTS CONSOLE HIV ANTIBODY (ROUTINE TESTING): HIV: NONREACTIVE

## 2017-03-25 LAB — OB RESULTS CONSOLE RPR: RPR: NONREACTIVE

## 2017-03-25 LAB — OB RESULTS CONSOLE GC/CHLAMYDIA
CHLAMYDIA, DNA PROBE: NEGATIVE
GC PROBE AMP, GENITAL: NEGATIVE

## 2017-06-03 NOTE — L&D Delivery Note (Signed)
Delivery Note At 720am a viable female was delivered via  (Presentation:vtx ;  loa).  APGAR:9 ,9 ; weight  .   Placenta status: intact , .  Cord: 3 vessels with the following complications: none.   Anesthesia:  Epidural Episiotomy:   Lacerations:  none Suture Repair: Est. Blood Loss (mL):  200  Mom to postpartum.  Baby to skin to skin couplet care  Lawson FiscalLori A Brandelyn Henne CNM 09/09/2017, 8:44 AM

## 2017-08-19 ENCOUNTER — Other Ambulatory Visit: Payer: Self-pay | Admitting: Certified Nurse Midwife

## 2017-08-19 ENCOUNTER — Encounter (HOSPITAL_COMMUNITY): Payer: Self-pay | Admitting: *Deleted

## 2017-08-19 ENCOUNTER — Other Ambulatory Visit: Payer: Self-pay

## 2017-08-19 ENCOUNTER — Inpatient Hospital Stay (HOSPITAL_COMMUNITY)
Admission: AD | Admit: 2017-08-19 | Discharge: 2017-08-19 | Disposition: A | Discharge status: Home / Self Care | Payer: Medicaid Other | Source: Ambulatory Visit | Attending: Obstetrics & Gynecology | Admitting: Obstetrics & Gynecology

## 2017-08-19 DIAGNOSIS — J029 Acute pharyngitis, unspecified: Secondary | ICD-10-CM | POA: Insufficient documentation

## 2017-08-19 DIAGNOSIS — O9989 Other specified diseases and conditions complicating pregnancy, childbirth and the puerperium: Secondary | ICD-10-CM

## 2017-08-19 DIAGNOSIS — O26893 Other specified pregnancy related conditions, third trimester: Secondary | ICD-10-CM | POA: Insufficient documentation

## 2017-08-19 DIAGNOSIS — Z3A33 33 weeks gestation of pregnancy: Secondary | ICD-10-CM | POA: Insufficient documentation

## 2017-08-19 DIAGNOSIS — R6889 Other general symptoms and signs: Secondary | ICD-10-CM | POA: Diagnosis not present

## 2017-08-19 DIAGNOSIS — Z3689 Encounter for other specified antenatal screening: Secondary | ICD-10-CM | POA: Diagnosis not present

## 2017-08-19 DIAGNOSIS — R509 Fever, unspecified: Secondary | ICD-10-CM | POA: Insufficient documentation

## 2017-08-19 LAB — RAPID STREP SCREEN (MED CTR MEBANE ONLY): Streptococcus, Group A Screen (Direct): POSITIVE — AB

## 2017-08-19 LAB — URINALYSIS, ROUTINE W REFLEX MICROSCOPIC
BILIRUBIN URINE: NEGATIVE
Glucose, UA: NEGATIVE mg/dL
Hgb urine dipstick: NEGATIVE
KETONES UR: 80 mg/dL — AB
Leukocytes, UA: NEGATIVE
NITRITE: NEGATIVE
PH: 6 (ref 5.0–8.0)
PROTEIN: NEGATIVE mg/dL
Specific Gravity, Urine: 1.018 (ref 1.005–1.030)

## 2017-08-19 LAB — INFLUENZA PANEL BY PCR (TYPE A & B)
Influenza A By PCR: NEGATIVE
Influenza B By PCR: NEGATIVE

## 2017-08-19 MED ORDER — ONDANSETRON 4 MG PO TBDP: 4.0000 mg | ORAL_TABLET | Freq: Three times a day (TID) | ORAL | 0 refills | Status: DC | PRN

## 2017-08-19 MED ORDER — OSELTAMIVIR PHOSPHATE 75 MG PO CAPS: 75.0000 mg | ORAL_CAPSULE | Freq: Two times a day (BID) | ORAL | 0 refills | Status: AC

## 2017-08-19 MED ORDER — PENICILLIN V POTASSIUM 500 MG PO TABS
500.0000 mg | ORAL_TABLET | Freq: Two times a day (BID) | ORAL | 0 refills | Status: DC
Start: 2017-08-19 — End: 2017-09-07

## 2017-08-19 NOTE — MAU Provider Note (Addendum)
History     CSN: [id number]  Arrival date and time: 08/19/17 [id number]   First Provider Initiated Contact with Patient 08/19/17 [phone]      Chief Complaint  Patient presents with  . Contractions  . Fever  . Sore Throat  . Chills   [id number] @33 .1 wks here with sore HA, throat, fever, chills, body aches, and ctx. Sx started yesterday. Temp was 99.0. Her kids had the flu last week. Ctx have been q10-15 min. No VB or LOF. Reports good FM.   OB History    Gravida Para Term Preterm AB Living   5 4 2 2  0 4   SAB TAB Ectopic Multiple Live Births   0 0 0 0 4      Obstetric Comments   2007-IOL FOR PREECLAMPSIA 2008 C/S FETAL DISTRESS      Past Medical History:  Diagnosis Date  . Infection 2005   gonorrhea  . Infection 2005   CHLAMYDIA  . Pregnancy induced hypertension 2007  . Preterm labor 2007    Past Surgical History:  Procedure Laterality Date  . CESAREAN SECTION      Family History  Problem Relation Age of Onset  . Alcohol abuse Mother   . Drug abuse Mother   . Aneurysm Father        BRAIN  . Cancer Maternal Aunt 52       BREAST  . Cancer Maternal Uncle        PROSTATE  . Diabetes Maternal Uncle   . Alcohol abuse Maternal Uncle   . Drug abuse Maternal Uncle   . Cancer Maternal Uncle        PROSTATE  . Alcohol abuse Maternal Uncle   . Drug abuse Maternal Uncle   . Cancer Maternal Uncle        BRAIN  . Alcohol abuse Maternal Uncle   . Drug abuse Maternal Uncle   . Alcohol abuse Sister   . Alcohol abuse Brother   . Asthma Son   . Birth defects Son        EXTRA DIGITS  . Seizures Son        FEBRILE  . Other Neg Hx     Social History   Tobacco Use  . Smoking status: Never Smoker  . Smokeless tobacco: Never Used  Substance Use Topics  . Alcohol use: No  . Drug use: No    Allergies: No Known Allergies  Medications Prior to Admission  Medication Sig Dispense Refill Last Dose  . acetaminophen (TYLENOL) 500 MG tablet Take 500 mg by mouth every 6  (six) hours as needed for moderate pain, fever or headache.   08/18/2017 at Unknown time  . metroNIDAZOLE (FLAGYL) 500 MG tablet Take 1 tablet (500 mg total) by mouth 2 (two) times daily. (Patient not taking: Reported on 08/19/2017) 14 tablet 0 Completed Course at Unknown time  . Prenatal Vit-Fe Fumarate-FA (PRENATAL MULTIVITAMIN) TABS tablet Take 1 tablet by mouth daily at 12 noon.   08/15/2017    Review of Systems  Constitutional: Positive for chills and fever.  HENT: Positive for sore throat.   Respiratory: Positive for shortness of breath. Negative for cough.   Gastrointestinal: Positive for abdominal pain (ctx).  Genitourinary: Negative for vaginal bleeding and vaginal discharge.  Musculoskeletal: Positive for myalgias.   Physical Exam   Blood pressure 130/69, pulse (!) 122, temperature 99.2 F (37.3 C), temperature source Oral, resp. rate 17, height 4\' 11"  (1.499 m), weight 197 lb (89.4  kg), last menstrual period 12/30/2016, SpO2 100 %.  Physical Exam  Constitutional: She is oriented to person, place, and time. She appears well-developed and well-nourished. No distress.  HENT:  Head: Normocephalic and atraumatic.  Mouth/Throat: Uvula is midline, oropharynx is clear and moist and mucous membranes are normal. No oropharyngeal exudate, posterior oropharyngeal edema, posterior oropharyngeal erythema or tonsillar abscesses.  Cardiovascular: Regular rhythm and normal heart sounds.  Mild tachycardia  Respiratory: Effort normal and breath sounds normal. No respiratory distress. She has no wheezes. She has no rales.  GI: Soft. She exhibits no distension. There is no tenderness.  gravid  Genitourinary:  Genitourinary Comments: SVE closed/thick  Musculoskeletal: Normal range of motion.  Neurological: She is alert and oriented to person, place, and time.  Skin: Skin is warm and dry.  Psychiatric: She has a normal mood and affect.  EFM: 125 bpm, mod variability, + accels, no decels Toco:  irritability  Results for orders placed or performed during the hospital encounter of 08/19/17 (from the past 24 hour(s))  Urinalysis, Routine w reflex microscopic     Status: Abnormal   Collection Time: 08/19/17  8:22 AM  Result Value Ref Range   Color, Urine YELLOW YELLOW   APPearance CLEAR CLEAR   Specific Gravity, Urine 1.018 1.005 - 1.030   pH 6.0 5.0 - 8.0   Glucose, UA NEGATIVE NEGATIVE mg/dL   Hgb urine dipstick NEGATIVE NEGATIVE   Bilirubin Urine NEGATIVE NEGATIVE   Ketones, ur 80 (A) NEGATIVE mg/dL   Protein, ur NEGATIVE NEGATIVE mg/dL   Nitrite NEGATIVE NEGATIVE   Leukocytes, UA NEGATIVE NEGATIVE  Influenza panel by PCR (type A & B)     Status: None   Collection Time: 08/19/17  9:10 AM  Result Value Ref Range   Influenza A By PCR NEGATIVE NEGATIVE   Influenza B By PCR NEGATIVE NEGATIVE   MAU Course  Procedures  MDM Labs ordered and reviewed. Flu pcr neg however will treat since high risk population, recent exposure, and presentation. Strep pending. Presentation, clinical findings, and plan discussed with Dr. Sallye Ober. Stable for discharge home.  Assessment and Plan   1. [redacted] weeks gestation of pregnancy   2. NST (non-stress test) reactive   3. Flu-like symptoms    Discharge home Follow up in OB office as scheduled Rx Tamiflu Rx Zofran Notify MD for worsening sx  Allergies as of 08/19/2017   No Known Allergies     Medication List    STOP taking these medications   metroNIDAZOLE 500 MG tablet Commonly known as:  FLAGYL     TAKE these medications   acetaminophen 500 MG tablet Commonly known as:  TYLENOL Take 500 mg by mouth every 6 (six) hours as needed for moderate pain, fever or headache.   ondansetron 4 MG disintegrating tablet Commonly known as:  ZOFRAN ODT Take 1 tablet (4 mg total) by mouth every 8 (eight) hours as needed for nausea or vomiting.   oseltamivir 75 MG capsule Commonly known as:  TAMIFLU Take 1 capsule (75 mg total) by mouth 2 (two)  times daily for 5 days.   prenatal multivitamin Tabs tablet Take 1 tablet by mouth daily at 12 noon.      Donette Larry, CNM 08/19/2017, 9:56 AM

## 2017-08-19 NOTE — MAU Note (Signed)
Patient given discharge instructions, reviewed meds, PTL S/S and Pre-e s/s  Reviewed. Pt to follow-up with her MD in 1 week and to follow up sooner if she worsens. Pt instructed to increase her po fluids and stay hydrated. Pt has no questions. Work note given.

## 2017-08-19 NOTE — MAU Note (Signed)
Pt reports fever, sore throat, body aches, chills since yesterday. States she started having contractions this am.

## 2017-08-19 NOTE — Discharge Instructions (Signed)

## 2017-08-19 NOTE — Progress Notes (Signed)
+  strep, Rx sent, message left for pt to return call

## 2017-08-19 NOTE — MAU Note (Signed)
Fever, (99.5) sore throat and chills starting yesterday morning progressively worse. Pt stating that she started having contractions last night. Denies N/V/D.

## 2017-08-20 ENCOUNTER — Telehealth: Payer: Self-pay | Admitting: Certified Nurse Midwife

## 2017-08-20 NOTE — Telephone Encounter (Signed)
+   strep A, Rx sent yesterday, pt did not answer.

## 2017-09-07 ENCOUNTER — Other Ambulatory Visit: Payer: Self-pay

## 2017-09-07 ENCOUNTER — Encounter (HOSPITAL_COMMUNITY): Payer: Self-pay | Admitting: *Deleted

## 2017-09-07 ENCOUNTER — Inpatient Hospital Stay (HOSPITAL_COMMUNITY)
Admission: AD | Admit: 2017-09-07 | Discharge: 2017-09-07 | Disposition: A | Discharge status: Home / Self Care | Payer: Medicaid Other | Source: Ambulatory Visit | Attending: Obstetrics and Gynecology | Admitting: Obstetrics and Gynecology

## 2017-09-07 DIAGNOSIS — Z3A35 35 weeks gestation of pregnancy: Secondary | ICD-10-CM

## 2017-09-07 DIAGNOSIS — O4700 False labor before 37 completed weeks of gestation, unspecified trimester: Secondary | ICD-10-CM

## 2017-09-07 DIAGNOSIS — O479 False labor, unspecified: Secondary | ICD-10-CM

## 2017-09-07 LAB — URINALYSIS, ROUTINE W REFLEX MICROSCOPIC
BILIRUBIN URINE: NEGATIVE
GLUCOSE, UA: NEGATIVE mg/dL
HGB URINE DIPSTICK: NEGATIVE
Ketones, ur: 5 mg/dL — AB
NITRITE: NEGATIVE
Protein, ur: NEGATIVE mg/dL
SPECIFIC GRAVITY, URINE: 1.012 (ref 1.005–1.030)
pH: 6 (ref 5.0–8.0)

## 2017-09-07 MED ORDER — LACTATED RINGERS IV BOLUS
1000.0000 mL | Freq: Once | INTRAVENOUS | Status: AC
  Administered 2017-09-07: 1000 mL via INTRAVENOUS

## 2017-09-07 MED ORDER — NIFEDIPINE 10 MG PO CAPS
20.0000 mg | ORAL_CAPSULE | ORAL | Status: DC | PRN
  Administered 2017-09-07: 20 mg via ORAL
  Filled 2017-09-07: qty 2

## 2017-09-07 MED ORDER — NIFEDIPINE 20 MG PO CAPS: 20.0000 mg | ORAL_CAPSULE | ORAL | 0 refills | Status: DC | PRN

## 2017-09-07 NOTE — MAU Note (Signed)
Pt presents with c/o ctxs since 0800. Denies LOF or VB, states did lose part of mucous plug.  Reports +FM.

## 2017-09-07 NOTE — Discharge Instructions (Signed)
Preterm Labor and Birth Information °Pregnancy normally lasts 39-41 weeks. Preterm labor is when labor starts early. It starts before you have been pregnant for 37 whole weeks. °What are the risk factors for preterm labor? °Preterm labor is more likely to occur in women who: °· Have an infection while pregnant. °· Have a cervix that is short. °· Have gone into preterm labor before. °· Have had surgery on their cervix. °· Are younger than age 32. °· Are older than age 35. °· Are African American. °· Are pregnant with two or more babies. °· Take street drugs while pregnant. °· Smoke while pregnant. °· Do not gain enough weight while pregnant. °· Got pregnant right after another pregnancy. ° °What are the symptoms of preterm labor? °Symptoms of preterm labor include: °· Cramps. The cramps may feel like the cramps some women get during their period. The cramps may happen with watery poop (diarrhea). °· Pain in the belly (abdomen). °· Pain in the lower back. °· Regular contractions or tightening. It may feel like your belly is getting tighter. °· Pressure in the lower belly that seems to get stronger. °· More fluid (discharge) leaking from the vagina. The fluid may be watery or bloody. °· Water breaking. ° °Why is it important to notice signs of preterm labor? °Babies who are born early may not be fully developed. They have a higher chance for: °· Long-term heart problems. °· Long-term lung problems. °· Trouble controlling body systems, like breathing. °· Bleeding in the brain. °· A condition called cerebral palsy. °· Learning difficulties. °· Death. ° °These risks are highest for babies who are born before 34 weeks of pregnancy. °How is preterm labor treated? °Treatment depends on: °· How long you were pregnant. °· Your condition. °· The health of your baby. ° °Treatment may involve: °· Having a stitch (suture) placed in your cervix. When you give birth, your cervix opens so the baby can come out. The stitch keeps the  cervix from opening too soon. °· Staying at the hospital. °· Taking or getting medicines, such as: °? Hormone medicines. °? Medicines to stop contractions. °? Medicines to help the baby’s lungs develop. °? Medicines to prevent your baby from having cerebral palsy. ° °What should I do if I am in preterm labor? °If you think you are going into labor too soon, call your doctor right away. °How can I prevent preterm labor? °· Do not use any tobacco products. °? Examples of these are cigarettes, chewing tobacco, and e-cigarettes. °? If you need help quitting, ask your doctor. °· Do not use street drugs. °· Do not use any medicines unless you ask your doctor if they are safe for you. °· Talk with your doctor before taking any herbal supplements. °· Make sure you gain enough weight. °· Watch for infection. If you think you might have an infection, get it checked right away. °· If you have gone into preterm labor before, tell your doctor. °This information is not intended to replace advice given to you by your health care provider. Make sure you discuss any questions you have with your health care provider. °Document Released: 08/16/2008 Document Revised: 10/31/2015 Document Reviewed: 10/11/2015 °Elsevier Interactive Patient Education © 2018 [employer] ° ° °Braxton Hicks Contractions °Contractions of the uterus can occur throughout pregnancy, but they are not always a sign that you are in labor. You may have practice contractions called Braxton Hicks contractions. These false labor contractions are sometimes confused with true labor. °  What are Braxton Hicks contractions? °Braxton Hicks contractions are tightening movements that occur in the muscles of the uterus before labor. Unlike true labor contractions, these contractions do not result in opening (dilation) and thinning of the cervix. Toward the end of pregnancy (32-34 weeks), Braxton Hicks contractions can happen more often and may become stronger. These  contractions are sometimes difficult to tell apart from true labor because they can be very uncomfortable. You should not feel embarrassed if you go to the hospital with false labor. °Sometimes, the only way to tell if you are in true labor is for your health care provider to look for changes in the cervix. The health care provider will do a physical exam and may monitor your contractions. If you are not in true labor, the exam should show that your cervix is not dilating and your water has not broken. °If there are other health problems associated with your pregnancy, it is completely safe for you to be sent home with false labor. You may continue to have Braxton Hicks contractions until you go into true labor. °How to tell the difference between true labor and false labor °True labor °· Contractions last 30-70 seconds. °· Contractions become very regular. °· Discomfort is usually felt in the top of the uterus, and it spreads to the lower abdomen and low back. °· Contractions do not go away with walking. °· Contractions usually become more intense and increase in frequency. °· The cervix dilates and gets thinner. °False labor °· Contractions are usually shorter and not as strong as true labor contractions. °· Contractions are usually irregular. °· Contractions are often felt in the front of the lower abdomen and in the groin. °· Contractions may go away when you walk around or change positions while lying down. °· Contractions get weaker and are shorter-lasting as time goes on. °· The cervix usually does not dilate or become thin. °Follow these instructions at home: °· Take over-the-counter and prescription medicines only as told by your health care provider. °· Keep up with your usual exercises and follow other instructions from your health care provider. °· Eat and drink lightly if you think you are going into labor. °· If Braxton Hicks contractions are making you uncomfortable: °? Change your position from lying  down or resting to walking, or change from walking to resting. °? Sit and rest in a tub of warm water. °? Drink enough fluid to keep your urine pale yellow. Dehydration may cause these contractions. °? Do slow and deep breathing several times an hour. °· Keep all follow-up prenatal visits as told by your health care provider. This is important. °Contact a health care provider if: °· You have a fever. °· You have continuous pain in your abdomen. °Get help right away if: °· Your contractions become stronger, more regular, and closer together. °· You have fluid leaking or gushing from your vagina. °· You pass blood-tinged mucus (bloody show). °· You have bleeding from your vagina. °· You have low back pain that you never had before. °· You feel your baby’s head pushing down and causing pelvic pressure. °· Your baby is not moving inside you as much as it used to. °Summary °· Contractions that occur before labor are called Braxton Hicks contractions, false labor, or practice contractions. °· Braxton Hicks contractions are usually shorter, weaker, farther apart, and less regular than true labor contractions. True labor contractions usually become progressively stronger and regular and they become more frequent. °· Manage discomfort   from Braxton Hicks contractions by changing position, resting in a warm bath, drinking plenty of water, or practicing deep breathing. °This information is not intended to replace advice given to you by your health care provider. Make sure you discuss any questions you have with your health care provider. °Document Released: 10/03/2016 Document Revised: 10/03/2016 Document Reviewed: 10/03/2016 °Elsevier Interactive Patient Education © 2018 [employer] Inc[employer] ° °

## 2017-09-07 NOTE — MAU Provider Note (Signed)
History   32 y/o [id number] @ 35 weeks 6 days EGA here complaining of contractions. She denies vaginal bleeding or leakage of fluid.   With normal gross fetal movement.    Patient Active Problem List   Diagnosis Date Noted  . Preterm delivery 11/03/2013  . Postpartum care and examination immediately after delivery 11/01/2013  . Supervision of high risk pregnancy in second trimester 07/22/2013  . Low birth weight 07/20/2013  . Hx of gestational diabetes in prior pregnancy, currently pregnant 07/20/2013  . Hx of preterm delivery, currently pregnant 07/20/2013  . History of congenital abnormalities 07/20/2013  . Obesity 07/20/2013  . Late prenatal care 07/20/2013  . 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 VBAC (vaginal birth after Cesarean) 10/27/2012  . Previous cesarean delivery, delivered, with or without mention of antepartum condition 06/16/2012  . Hx of pre-eclampsia in prior pregnancy, currently pregnant 06/16/2012    Chief Complaint  Patient presents with  . Contractions   HPI  OB History    Gravida  5   Para  4   Term  2   Preterm  2   AB  0   Living  4     SAB  0   TAB  0   Ectopic  0   Multiple  0   Live Births  4        Obstetric Comments  2007-IOL FOR PREECLAMPSIA 2008 C/S FETAL DISTRESS        Past Medical History:  Diagnosis Date  . Infection 2005   gonorrhea  . Infection 2005   CHLAMYDIA  . Pregnancy induced hypertension 2007  . Preterm labor 2007    Past Surgical History:  Procedure Laterality Date  . CESAREAN SECTION      Family History  Problem Relation Age of Onset  . Alcohol abuse Mother   . Drug abuse Mother   . Aneurysm Father        BRAIN  . Cancer Maternal Aunt 52       BREAST  . Cancer Maternal Uncle        PROSTATE  . Diabetes Maternal Uncle   . Alcohol abuse Maternal Uncle   . Drug abuse Maternal Uncle   . Cancer Maternal Uncle        PROSTATE  . Alcohol abuse Maternal Uncle   . Drug abuse Maternal  Uncle   . Cancer Maternal Uncle        BRAIN  . Alcohol abuse Maternal Uncle   . Drug abuse Maternal Uncle   . Alcohol abuse Sister   . Alcohol abuse Brother   . Asthma Son   . Birth defects Son        EXTRA DIGITS  . Seizures Son        FEBRILE  . Other Neg Hx     Social History   Tobacco Use  . Smoking status: Never Smoker  . Smokeless tobacco: Never Used  Substance Use Topics  . Alcohol use: No  . Drug use: No    Allergies: No Known Allergies  Medications Prior to Admission  Medication Sig Dispense Refill Last Dose  . Prenatal Vit-Fe Fumarate-FA (PRENATAL MULTIVITAMIN) TABS tablet Take 1 tablet by mouth daily at 12 noon.   Past Week at Unknown time  . ondansetron (ZOFRAN ODT) 4 MG disintegrating tablet Take 1 tablet (4 mg total) by mouth every 8 (eight) hours as needed for nausea or vomiting. (Patient not taking: Reported on 09/07/2017) 20 tablet  0 Not Taking at Unknown time  . penicillin v potassium (VEETID) 500 MG tablet Take 1 tablet (500 mg total) by mouth 2 (two) times daily. (Patient not taking: Reported on 09/07/2017) 20 tablet 0 Completed Course at Unknown time    ROS  Constitutional: Denies fevers/chills Cardiovascular: Denies chest pain or palpitations Pulmonary: Denies coughing or wheezing Gastrointestinal: Denies nausea, vomiting or diarrhea Genitourinary: Denies pelvic pain, unusual vaginal bleeding, unusual vaginal discharge, dysuria, urgency or frequency. With contractions.  Musculoskeletal: Denies muscle or joint aches and pain.  Neurology: Denies abnormal sensations such as tingling or numbness.   Physical Exam   Blood pressure 125/66, pulse 92, temperature 98.4 F (36.9 C), temperature source Oral, resp. rate 20, height 4\' 11"  (1.499 m), weight 92.1 kg (203 lb), last menstrual period 12/30/2016, SpO2 99 %.  Physical Exam Gen: NAD Abdomen: Soft, non tender, gravid. Extremities: Warm and well perfused.  2+ edema right leg, 1+ edema left leg.  NST:  130 BAseline, moderate variability, reactive.  TOCO: Irregular contractions Q 6- 9 minutes.  ED Course Cervical exam showed 4/50%/-3 which remained unchanged about 3 hours later.  She received IV hydration and procardia that allowed the contractions to space out.  Patient felt improved.  She declined betamethasone for fetal lung maturity after explaining the risks, benefits and alternatives of the medication.   Assessment: 32 y/o [id number] @ 35 weeks 6 days EGA with preterm contractions, no recent cervical change. Plan: Discharge to home with preterm labor precautions.  Declines Betamethasone. May use procardia for contractions prn, 12 tabs given.  Konrad Felix MD 09/07/2017 4:05 PM

## 2017-09-08 ENCOUNTER — Other Ambulatory Visit: Payer: Self-pay

## 2017-09-08 ENCOUNTER — Encounter (HOSPITAL_COMMUNITY): Payer: Self-pay | Admitting: *Deleted

## 2017-09-08 ENCOUNTER — Inpatient Hospital Stay (HOSPITAL_COMMUNITY)
Admission: AD | Admit: 2017-09-08 | Discharge: 2017-09-11 | DRG: 805 | Disposition: A | Discharge status: Home / Self Care | Payer: Medicaid Other | Source: Ambulatory Visit | Attending: Obstetrics and Gynecology | Admitting: Obstetrics and Gynecology

## 2017-09-08 ENCOUNTER — Inpatient Hospital Stay (HOSPITAL_COMMUNITY): Payer: Medicaid Other | Admitting: Anesthesiology

## 2017-09-08 DIAGNOSIS — O42913 Preterm premature rupture of membranes, unspecified as to length of time between rupture and onset of labor, third trimester: Principal | ICD-10-CM | POA: Diagnosis present

## 2017-09-08 DIAGNOSIS — O99824 Streptococcus B carrier state complicating childbirth: Secondary | ICD-10-CM | POA: Diagnosis present

## 2017-09-08 DIAGNOSIS — Z3A36 36 weeks gestation of pregnancy: Secondary | ICD-10-CM

## 2017-09-08 DIAGNOSIS — O34211 Maternal care for low transverse scar from previous cesarean delivery: Secondary | ICD-10-CM | POA: Diagnosis present

## 2017-09-08 DIAGNOSIS — O42919 Preterm premature rupture of membranes, unspecified as to length of time between rupture and onset of labor, unspecified trimester: Secondary | ICD-10-CM | POA: Diagnosis present

## 2017-09-08 DIAGNOSIS — Z141 Cystic fibrosis carrier: Secondary | ICD-10-CM | POA: Diagnosis not present

## 2017-09-08 LAB — CBC
HCT: 32.4 % — ABNORMAL LOW (ref 36.0–46.0)
HEMOGLOBIN: 11.6 g/dL — AB (ref 12.0–15.0)
MCH: 28.9 pg (ref 26.0–34.0)
MCHC: 35.8 g/dL (ref 30.0–36.0)
MCV: 80.8 fL (ref 78.0–100.0)
Platelets: 206 10*3/uL (ref 150–400)
RBC: 4.01 MIL/uL (ref 3.87–5.11)
RDW: 15.1 % (ref 11.5–15.5)
WBC: 7.1 10*3/uL (ref 4.0–10.5)

## 2017-09-08 LAB — POCT FERN TEST: POCT Fern Test: POSITIVE

## 2017-09-08 LAB — TYPE AND SCREEN
ABO/RH(D): A POS
ANTIBODY SCREEN: NEGATIVE

## 2017-09-08 MED ORDER — PHENYLEPHRINE 40 MCG/ML (10ML) SYRINGE FOR IV PUSH (FOR BLOOD PRESSURE SUPPORT): 80.0000 ug | PREFILLED_SYRINGE | INTRAVENOUS | Status: DC | PRN

## 2017-09-08 MED ORDER — EPHEDRINE 5 MG/ML INJ: 10.0000 mg | INTRAVENOUS | Status: DC | PRN

## 2017-09-08 MED ORDER — EPHEDRINE 5 MG/ML INJ
10.0000 mg | INTRAVENOUS | Status: DC | PRN
  Filled 2017-09-08: qty 2

## 2017-09-08 MED ORDER — LACTATED RINGERS IV SOLN: 500.0000 mL | INTRAVENOUS | Status: DC | PRN

## 2017-09-08 MED ORDER — ONDANSETRON HCL 4 MG/2ML IJ SOLN: 4.0000 mg | Freq: Four times a day (QID) | INTRAMUSCULAR | Status: DC | PRN

## 2017-09-08 MED ORDER — TERBUTALINE SULFATE 1 MG/ML IJ SOLN
0.2500 mg | Freq: Once | INTRAMUSCULAR | Status: DC | PRN
  Filled 2017-09-08: qty 1

## 2017-09-08 MED ORDER — OXYTOCIN BOLUS FROM INFUSION
500.0000 mL | Freq: Once | INTRAVENOUS | Status: AC
  Administered 2017-09-09: 500 mL via INTRAVENOUS

## 2017-09-08 MED ORDER — DIPHENHYDRAMINE HCL 50 MG/ML IJ SOLN: 12.5000 mg | INTRAMUSCULAR | Status: DC | PRN

## 2017-09-08 MED ORDER — FENTANYL 2.5 MCG/ML BUPIVACAINE 1/10 % EPIDURAL INFUSION (WH - ANES): 14.0000 mL/h | INTRAMUSCULAR | Status: DC | PRN

## 2017-09-08 MED ORDER — SODIUM CHLORIDE 0.9 % IV SOLN
5.0000 10*6.[IU] | Freq: Once | INTRAVENOUS | Status: AC
  Administered 2017-09-08: 5 10*6.[IU] via INTRAVENOUS
  Filled 2017-09-08: qty 5

## 2017-09-08 MED ORDER — OXYCODONE-ACETAMINOPHEN 5-325 MG PO TABS: 2.0000 | ORAL_TABLET | ORAL | Status: DC | PRN

## 2017-09-08 MED ORDER — FENTANYL 2.5 MCG/ML BUPIVACAINE 1/10 % EPIDURAL INFUSION (WH - ANES)
14.0000 mL/h | INTRAMUSCULAR | Status: DC | PRN
  Administered 2017-09-08 – 2017-09-09 (×2): 14 mL/h via EPIDURAL
  Filled 2017-09-08 (×2): qty 100

## 2017-09-08 MED ORDER — FENTANYL CITRATE (PF) 100 MCG/2ML IJ SOLN: 50.0000 ug | INTRAMUSCULAR | Status: DC | PRN

## 2017-09-08 MED ORDER — LIDOCAINE HCL (PF) 1 % IJ SOLN
INTRAMUSCULAR | Status: DC | PRN
  Administered 2017-09-08: 5 mL

## 2017-09-08 MED ORDER — LIDOCAINE HCL (PF) 1 % IJ SOLN
30.0000 mL | INTRAMUSCULAR | Status: DC | PRN
  Filled 2017-09-08: qty 30

## 2017-09-08 MED ORDER — PHENYLEPHRINE 40 MCG/ML (10ML) SYRINGE FOR IV PUSH (FOR BLOOD PRESSURE SUPPORT)
80.0000 ug | PREFILLED_SYRINGE | INTRAVENOUS | Status: DC | PRN
  Filled 2017-09-08: qty 5

## 2017-09-08 MED ORDER — OXYTOCIN 40 UNITS IN LACTATED RINGERS INFUSION - SIMPLE MED: 2.5000 [IU]/h | INTRAVENOUS | Status: DC

## 2017-09-08 MED ORDER — EPHEDRINE 5 MG/ML INJ
10.0000 mg | INTRAVENOUS | Status: DC | PRN
Start: 2017-09-08 — End: 2017-09-09
  Filled 2017-09-08: qty 2

## 2017-09-08 MED ORDER — LACTATED RINGERS IV SOLN
INTRAVENOUS | Status: DC
  Administered 2017-09-08: 22:00:00 via INTRAVENOUS

## 2017-09-08 MED ORDER — ACETAMINOPHEN 325 MG PO TABS: 650.0000 mg | ORAL_TABLET | ORAL | Status: DC | PRN

## 2017-09-08 MED ORDER — OXYTOCIN 40 UNITS IN LACTATED RINGERS INFUSION - SIMPLE MED
1.0000 m[IU]/min | INTRAVENOUS | Status: DC
  Administered 2017-09-09: 6 m[IU]/min via INTRAVENOUS
  Administered 2017-09-09: 1 m[IU]/min via INTRAVENOUS
  Filled 2017-09-08: qty 1000

## 2017-09-08 MED ORDER — OXYCODONE-ACETAMINOPHEN 5-325 MG PO TABS: 1.0000 | ORAL_TABLET | ORAL | Status: DC | PRN

## 2017-09-08 MED ORDER — LACTATED RINGERS IV SOLN: 500.0000 mL | Freq: Once | INTRAVENOUS | Status: DC

## 2017-09-08 MED ORDER — PHENYLEPHRINE 40 MCG/ML (10ML) SYRINGE FOR IV PUSH (FOR BLOOD PRESSURE SUPPORT)
80.0000 ug | PREFILLED_SYRINGE | INTRAVENOUS | Status: DC | PRN
  Filled 2017-09-08: qty 5
  Filled 2017-09-08: qty 10

## 2017-09-08 MED ORDER — PENICILLIN G POT IN DEXTROSE 60000 UNIT/ML IV SOLN
3.0000 10*6.[IU] | INTRAVENOUS | Status: DC
  Administered 2017-09-08 – 2017-09-09 (×3): 3 10*6.[IU] via INTRAVENOUS
  Filled 2017-09-08 (×4): qty 50

## 2017-09-08 MED ORDER — SOD CITRATE-CITRIC ACID 500-334 MG/5ML PO SOLN: 30.0000 mL | ORAL | Status: DC | PRN

## 2017-09-08 NOTE — Anesthesia Pain Management Evaluation Note (Signed)
  CRNA Pain Management Visit Note  Patient: Paula Livingston, 32 y.o., female  "Hello I am a member of the anesthesia team at [facility]. We have an anesthesia team available at all times to provide care throughout the hospital, including epidural management and anesthesia for C-section. I don't know your plan for the delivery whether it a natural birth, water birth, IV sedation, nitrous supplementation, doula or epidural, but we want to meet your pain goals."   1.Was your pain managed to your expectations on prior hospitalizations?   Yes   2.What is your expectation for pain management during this hospitalization?     Epidural  3.How can we help you reach that goal? epidural  Record the patient's initial score and the patient's pain goal.   Pain: 10  Pain Goal: 10 [facility] wants you to be able to say your pain was always managed very well.  Paula Livingston 09/08/2017

## 2017-09-08 NOTE — H&P (Signed)
Paula Livingston is a 32 y.o. female presenting for PPROM. OB History    Gravida  5   Para  4   Term  2   Preterm  2   AB  0   Living  4     SAB  0   TAB  0   Ectopic  0   Multiple  0   Live Births  4        Obstetric Comments  2007-IOL FOR PREECLAMPSIA 2008 C/S FETAL DISTRESS       Past Medical History:  Diagnosis Date  . Infection 2005   gonorrhea  . Infection 2005   CHLAMYDIA  . Pregnancy induced hypertension 2007  . Preterm labor 2007   Past Surgical History:  Procedure Laterality Date  . CESAREAN SECTION     Family History: family history includes Alcohol abuse in her brother, maternal uncle, maternal uncle, maternal uncle, mother, and sister; Aneurysm in her father; Asthma in her son; Birth defects in her son; Cancer in her maternal uncle, maternal uncle, and maternal uncle; Cancer (age of onset: 53) in her maternal aunt; Diabetes in her maternal uncle; Drug abuse in her maternal uncle, maternal uncle, maternal uncle, and mother; Seizures in her son. Social History:  reports that she has never smoked. She has never used smokeless tobacco. She reports that she does not drink alcohol or use drugs.     Maternal Diabetes: No Genetic Screening: Normal Maternal Ultrasounds/Referrals: Normal Fetal Ultrasounds or other Referrals:  None Maternal Substance Abuse:  No Significant Maternal Medications:  None Significant Maternal Lab Results:  Lab values include: Group B Strep positive Other Comments:  None  Review of Systems  Genitourinary:       SROM clear fluid  All other systems reviewed and are negative.  Maternal Medical History:  Reason for admission: Rupture of membranes.   Contractions: Onset was 3-5 hours ago.   Frequency: irregular.   Perceived severity is mild.    Fetal activity: Perceived fetal activity is normal.   Last perceived fetal movement was within the past hour.    Prenatal complications: Preterm labor.   Prenatal Complications -  Diabetes: none.    Dilation: 3.5 Effacement (%): 70 Station: -3 Exam by:: Millner, RN  Blood pressure 124/70, pulse 88, temperature 98.6 F (37 C), temperature source Oral, resp. rate 18, height 4\' 11"  (1.499 m), weight 202 lb (91.6 kg), last menstrual period 12/30/2016. Maternal Exam:  Uterine Assessment: Contraction strength is mild.  Contraction frequency is irregular.   Abdomen: Patient reports no abdominal tenderness. Estimated fetal weight is 6 pounds.   Fetal presentation: vertex  Introitus: Normal vulva. Normal vagina.  Amniotic fluid character: clear.  Pelvis: adequate for delivery.      Fetal Exam Fetal Monitor Review: Mode: ultrasound.   Variability: moderate (6-25 bpm).   Pattern: accelerations present.    Fetal State Assessment: Category I - tracings are normal.     Physical Exam  Nursing note and vitals reviewed. Constitutional: She is oriented to person, place, and time. She appears well-developed and well-nourished.  Neck: Normal range of motion.  Cardiovascular: Normal rate.  Respiratory: Effort normal.  GI: Soft. Bowel sounds are normal. She exhibits no distension.  Genitourinary: Vagina normal.  Musculoskeletal: Normal range of motion.  Neurological: She is alert and oriented to person, place, and time.  Skin: Skin is warm and dry.  Psychiatric: She has a normal mood and affect. Her behavior is normal. Thought content normal.  Prenatal labs: ABO, Rh: --/--/A POS, A POS Performed at [facility], [address] [place], [place] [address]  [phone](04/08 1825) Antibody: NEG (04/08 1825) Rubella: Immune (10/23 0000) RPR: Nonreactive (10/23 0000)  HBsAg: Negative (10/23 0000)  HIV: Non-reactive (10/23 0000)  GBS: Positive (10/22 0000)   Assessment/Plan: Preterm Labor-   PPROM VBAC GBS positive Expectant Management GBS ABX Prophylaxis Possible Epidural Anticipate Vaginal Delivery  Lori A Clemmons CNM 09/08/2017, 8:38 PM   +

## 2017-09-08 NOTE — Anesthesia Preprocedure Evaluation (Signed)
Anesthesia Evaluation  Patient identified by MRN, date of birth, ID band Patient awake    Reviewed: Allergy & Precautions  Airway Mallampati: III       Dental  (+) Teeth Intact   Pulmonary neg pulmonary ROS,    Pulmonary exam normal        Cardiovascular hypertension, negative cardio ROS   Rhythm:Regular Rate:Normal     Neuro/Psych negative neurological ROS  negative psych ROS   GI/Hepatic negative GI ROS, Neg liver ROS,   Endo/Other  negative endocrine ROS  Renal/GU negative Renal ROS     Musculoskeletal negative musculoskeletal ROS (+)   Abdominal   Peds  Hematology   Anesthesia Other Findings   Reproductive/Obstetrics (+) Pregnancy                             Anesthesia Physical Anesthesia Plan  ASA: III  Anesthesia Plan: Epidural   Post-op Pain Management:    Induction:   PONV Risk Score and Plan:   Airway Management Planned:   Additional Equipment:   Intra-op Plan:   Post-operative Plan:   Informed Consent: I have reviewed the patients History and Physical, chart, labs and discussed the procedure including the risks, benefits and alternatives for the proposed anesthesia with the patient or authorized representative who has indicated his/her understanding and acceptance.     Plan Discussed with:   Anesthesia Plan Comments: (Lab Results      Component                Value               Date                      WBC                      7.1                 09/08/2017                HGB                      11.6 (L)            09/08/2017                HCT                      32.4 (L)            09/08/2017                MCV                      80.8                09/08/2017                PLT                      206                 09/08/2017           )        Anesthesia Quick Evaluation

## 2017-09-08 NOTE — Anesthesia Procedure Notes (Signed)
Epidural Patient location during procedure: OB Start time: 09/08/2017 9:39 PM End time: 09/08/2017 9:47 PM  Staffing Anesthesiologist: Shelton SilvasHollis, Kevin D, MD Performed: anesthesiologist   Preanesthetic Checklist Completed: patient identified, site marked, surgical consent, pre-op evaluation, timeout performed, IV checked, risks and benefits discussed and monitors and equipment checked  Epidural Patient position: sitting Prep: ChloraPrep Patient monitoring: heart rate, continuous pulse ox and blood pressure Approach: midline Location: L3-L4 Injection technique: LOR saline  Needle:  Needle type: Tuohy  Needle gauge: 17 G Needle length: 9 cm Catheter type: closed end flexible Catheter size: 20 Guage Test dose: negative and 1.5% lidocaine  Assessment Events: blood not aspirated, injection not painful, no injection resistance and no paresthesia  Additional Notes LOR @ 5.5  Patient identified. Risks/Benefits/Options discussed with patient including but not limited to bleeding, infection, nerve damage, paralysis, failed block, incomplete pain control, headache, blood pressure changes, nausea, vomiting, reactions to medications, itching and postpartum back pain. Confirmed with bedside nurse the patient's most recent platelet count. Confirmed with patient that they are not currently taking any anticoagulation, have any bleeding history or any family history of bleeding disorders. Patient expressed understanding and wished to proceed. All questions were answered. Sterile technique was used throughout the entire procedure. Please see nursing notes for vital signs. Test dose was given through epidural catheter and negative prior to continuing to dose epidural or start infusion. Warning signs of high block given to the patient including shortness of breath, tingling/numbness in hands, complete motor block, or any concerning symptoms with instructions to call for help. Patient was given instructions on  fall risk and not to get out of bed. All questions and concerns addressed with instructions to call with any issues or inadequate analgesia.    Reason for block:procedure for pain

## 2017-09-08 NOTE — MAU Note (Signed)
Pt. Here due to gush of fluid at 1630 today, clear fluid, large amount. Positive for fetal movement, denies vaginal bleeding. EFM applied - FHR 120s, Toco applied, abd. Soft. Contractions are every eight minutes apart..per patient.

## 2017-09-09 ENCOUNTER — Encounter (HOSPITAL_COMMUNITY): Payer: Self-pay | Admitting: *Deleted

## 2017-09-09 LAB — RPR: RPR Ser Ql: NONREACTIVE

## 2017-09-09 LAB — ABO/RH: ABO/RH(D): A POS

## 2017-09-09 MED ORDER — BENZOCAINE-MENTHOL 20-0.5 % EX AERO
1.0000 "application " | INHALATION_SPRAY | CUTANEOUS | Status: DC | PRN
  Administered 2017-09-09: 1 via TOPICAL
  Filled 2017-09-09: qty 56

## 2017-09-09 MED ORDER — MEDROXYPROGESTERONE ACETATE 150 MG/ML IM SUSP: 150.0000 mg | Freq: Once | INTRAMUSCULAR | Status: DC

## 2017-09-09 MED ORDER — ZOLPIDEM TARTRATE 5 MG PO TABS: 5.0000 mg | ORAL_TABLET | Freq: Every evening | ORAL | Status: DC | PRN

## 2017-09-09 MED ORDER — OXYTOCIN 40 UNITS IN LACTATED RINGERS INFUSION - SIMPLE MED: 1.0000 m[IU]/min | INTRAVENOUS | Status: DC

## 2017-09-09 MED ORDER — DIPHENHYDRAMINE HCL 25 MG PO CAPS: 25.0000 mg | ORAL_CAPSULE | Freq: Four times a day (QID) | ORAL | Status: DC | PRN

## 2017-09-09 MED ORDER — IBUPROFEN 600 MG PO TABS
600.0000 mg | ORAL_TABLET | Freq: Four times a day (QID) | ORAL | Status: DC
  Administered 2017-09-09 – 2017-09-11 (×9): 600 mg via ORAL
  Filled 2017-09-09 (×9): qty 1

## 2017-09-09 MED ORDER — SENNOSIDES-DOCUSATE SODIUM 8.6-50 MG PO TABS
2.0000 | ORAL_TABLET | ORAL | Status: DC
  Administered 2017-09-09: 2 via ORAL
  Filled 2017-09-09 (×2): qty 2

## 2017-09-09 MED ORDER — DIBUCAINE 1 % RE OINT: 1.0000 "application " | TOPICAL_OINTMENT | RECTAL | Status: DC | PRN

## 2017-09-09 MED ORDER — ACETAMINOPHEN 325 MG PO TABS
650.0000 mg | ORAL_TABLET | ORAL | Status: DC | PRN
  Administered 2017-09-10: 650 mg via ORAL
  Filled 2017-09-09: qty 2

## 2017-09-09 MED ORDER — WITCH HAZEL-GLYCERIN EX PADS: 1.0000 "application " | MEDICATED_PAD | CUTANEOUS | Status: DC | PRN

## 2017-09-09 MED ORDER — TETANUS-DIPHTH-ACELL PERTUSSIS 5-2.5-18.5 LF-MCG/0.5 IM SUSP: 0.5000 mL | Freq: Once | INTRAMUSCULAR | Status: DC

## 2017-09-09 MED ORDER — PRENATAL MULTIVITAMIN CH
1.0000 | ORAL_TABLET | Freq: Every day | ORAL | Status: DC
  Administered 2017-09-09 – 2017-09-11 (×3): 1 via ORAL
  Filled 2017-09-09 (×3): qty 1

## 2017-09-09 MED ORDER — MEASLES, MUMPS & RUBELLA VAC ~~LOC~~ INJ
0.5000 mL | INJECTION | Freq: Once | SUBCUTANEOUS | Status: DC
  Filled 2017-09-09: qty 0.5

## 2017-09-09 MED ORDER — SIMETHICONE 80 MG PO CHEW: 80.0000 mg | CHEWABLE_TABLET | ORAL | Status: DC | PRN

## 2017-09-09 MED ORDER — COCONUT OIL OIL: 1.0000 "application " | TOPICAL_OIL | Status: DC | PRN

## 2017-09-09 MED ORDER — ONDANSETRON HCL 4 MG PO TABS: 4.0000 mg | ORAL_TABLET | ORAL | Status: DC | PRN

## 2017-09-09 MED ORDER — ONDANSETRON HCL 4 MG/2ML IJ SOLN: 4.0000 mg | INTRAMUSCULAR | Status: DC | PRN

## 2017-09-09 NOTE — Anesthesia Postprocedure Evaluation (Signed)
Anesthesia Post Note  Patient: Paula Livingston  Procedure(s) Performed: AN AD HOC LABOR EPIDURAL     Patient location during evaluation: Mother Baby Anesthesia Type: Epidural Level of consciousness: awake Pain management: satisfactory to patient Vital Signs Assessment: post-procedure vital signs reviewed and stable Respiratory status: spontaneous breathing Cardiovascular status: stable Anesthetic complications: no    Last Vitals:  Vitals:   09/09/17 0911 09/09/17 1038  BP: 124/72 132/69  Pulse: 83 89  Resp: 16   Temp: 37 C 36.9 C  SpO2:  100%    Last Pain:  Vitals:   09/09/17 1215  TempSrc:   PainSc: (P) 6    Pain Goal:                 KeyCorpBURGER,Anayia Eugene

## 2017-09-09 NOTE — Lactation Note (Signed)
This note was copied from a baby's chart. Lactation Consultation Note  Patient Name: Boy Harvin HazelBonita Seabolt ZOXWR'UToday's Date: 09/09/2017 Reason for consult: Initial assessment;Late-preterm 34-36.6wks This is mom's fifth baby and previous babies were born early.  She breastfed babies and states she has difficulty in the beginning due to inverted nipples.  Newborn is 6 hours old and has had one attempt at breast.  Baby is currently sleepy.  Supplementation discussed and mom agreeable.  Mom pumping with her Medela pump but no milk obtained.  Instructed to attempt to latch baby with feeding cues or every 3 hours followed by pumping and supplementing per day of life guidelines given.  Breastfeeding consultation services and Caring For Your Late Preterm Baby handout given to patient.  Encouraged to call out for assist prn.  Maternal Data Has patient been taught Hand Expression?: Yes Does the patient have breastfeeding experience prior to this delivery?: Yes  Feeding Feeding Type: Bottle Fed - Formula Nipple Type: Slow - flow  LATCH Score                   Interventions    Lactation Tools Discussed/Used Pump Review: Setup, frequency, and cleaning;Milk Storage Initiated by:: RN Date initiated:: 09/09/17   Consult Status Consult Status: Follow-up Date: 09/10/17    Huston FoleyMOULDEN, Lealand Elting S 09/09/2017, 2:10 PM

## 2017-09-09 NOTE — Progress Notes (Signed)
LABOR PROGRESS NOTE  Paula HazelBonita Livingston is a 32 y.o. [phone] at 414w1d  admitted for   Subjective: Pitocin has been on since approx 130 am and pt has not had any cervical change. Possible very small forebag noted on exam but attempted to arom with amnihook and no fluid return. IUPC attempt unsucessful due to meeting resistance from 3-9 0clock. Comfortable with epidural.  Objective: BP 126/72   Pulse 77   Temp 98.4 F (36.9 C) (Oral)   Resp 17   Ht 4\' 11"  (1.499 m)   Wt 202 lb (91.6 kg)   LMP 12/30/2016   SpO2 98%   BMI 40.80 kg/m  or  Vitals:   09/09/17 0453 09/09/17 0501 09/09/17 0601 09/09/17 0630  BP:  117/76 120/68 126/72  Pulse:  80 76 77  Resp:  16 17   Temp: 98.4 F (36.9 C)     TempSrc: Oral     SpO2:      Weight:      Height:        Cat 1 uc's q 4-5 minutes Dilation: 4 Effacement (%): 70 Cervical Position: Posterior Station: -2 Presentation: Vertex Exam by:: Ileana LaddLexie Ament, RN  Labs: Lab Results  Component Value Date   WBC 7.1 09/08/2017   HGB 11.6 (L) 09/08/2017   HCT 32.4 (L) 09/08/2017   MCV 80.8 09/08/2017   PLT 206 09/08/2017    Patient Active Problem List   Diagnosis Date Noted  . Preterm premature rupture of membranes (PPROM) with unknown onset of labor 09/08/2017  . Preterm delivery 11/03/2013  . Postpartum care and examination immediately after delivery 11/01/2013  . Supervision of high risk pregnancy in second trimester 07/22/2013  . Low birth weight 07/20/2013  . Hx of gestational diabetes in prior pregnancy, currently pregnant 07/20/2013  . Hx of preterm delivery, currently pregnant 07/20/2013  . History of congenital abnormalities 07/20/2013  . Obesity 07/20/2013  . Late prenatal care 07/20/2013  . 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 VBAC (vaginal birth after Cesarean) 10/27/2012  . Previous cesarean delivery, delivered, with or without mention of antepartum condition 06/16/2012  . Hx of pre-eclampsia in prior pregnancy, currently  pregnant 06/16/2012    Assessment / Plan: 32 y.o. [id number] at 444w1d here for PPROM  Labor: Early Fetal Wellbeing:  Cat 1 Pain Control: epidural Anticipated MOD:  SVD  Lori A Clemmons CNM 09/09/2017, 6:51 AM

## 2017-09-09 NOTE — Progress Notes (Signed)
LABOR PROGRESS NOTE  Harvin HazelBonita Alviar is a 32 y.o. [phone] at 3734w1d  admitted for PPROM  Subjective: Pitocin started @ 135am  Objective: BP 119/70   Pulse 74   Temp 97.7 F (36.5 C) (Oral)   Resp 16   Ht 4\' 11"  (1.499 m)   Wt 202 lb (91.6 kg)   LMP 12/30/2016   SpO2 98%   BMI 40.80 kg/m  or  Vitals:   09/09/17 0031 09/09/17 0101 09/09/17 0138 09/09/17 0201  BP: 101/67 106/69 112/66 119/70  Pulse: 73 82 76 74  Resp:    16  Temp:   97.7 F (36.5 C)   TempSrc:   Oral   SpO2:      Weight:      Height:        FHR CAT 1 Uc's q 1-2  Dilation: 4 Effacement (%): 70 Cervical Position: Posterior Station: -2 Presentation: Vertex Exam by:: Ileana LaddLexie Ament, RN  Labs: Lab Results  Component Value Date   WBC 7.1 09/08/2017   HGB 11.6 (L) 09/08/2017   HCT 32.4 (L) 09/08/2017   MCV 80.8 09/08/2017   PLT 206 09/08/2017    Patient Active Problem List   Diagnosis Date Noted  . Preterm premature rupture of membranes (PPROM) with unknown onset of labor 09/08/2017  . Preterm delivery 11/03/2013  . Postpartum care and examination immediately after delivery 11/01/2013  . Supervision of high risk pregnancy in second trimester 07/22/2013  . Low birth weight 07/20/2013  . Hx of gestational diabetes in prior pregnancy, currently pregnant 07/20/2013  . Hx of preterm delivery, currently pregnant 07/20/2013  . History of congenital abnormalities 07/20/2013  . Obesity 07/20/2013  . Late prenatal care 07/20/2013  . 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 VBAC (vaginal birth after Cesarean) 10/27/2012  . Previous cesarean delivery, delivered, with or without mention of antepartum condition 06/16/2012  . Hx of pre-eclampsia in prior pregnancy, currently pregnant 06/16/2012    Assessment / Plan: 32 y.o. [id number] at 2334w1d here for PPROM, VBAC  Labor: Early Fetal Wellbeing:  Cat 1 Pain Control:  Epidural Anticipated MOD:  Vaginal delivery  Shalev Helminiak A Danyale Ridinger CNM 09/09/2017, 2:12 AM

## 2017-09-10 ENCOUNTER — Other Ambulatory Visit: Payer: Self-pay

## 2017-09-10 LAB — CBC
HCT: 32.1 % — ABNORMAL LOW (ref 36.0–46.0)
Hemoglobin: 11.5 g/dL — ABNORMAL LOW (ref 12.0–15.0)
MCH: 29 pg (ref 26.0–34.0)
MCHC: 35.8 g/dL (ref 30.0–36.0)
MCV: 80.9 fL (ref 78.0–100.0)
PLATELETS: 165 10*3/uL (ref 150–400)
RBC: 3.97 MIL/uL (ref 3.87–5.11)
RDW: 15.2 % (ref 11.5–15.5)
WBC: 7.1 10*3/uL (ref 4.0–10.5)

## 2017-09-10 NOTE — Progress Notes (Signed)
Post Partum Day 1 Subjective: Doing well. No complaints, Breastfeeding  Objective: Blood pressure 123/84, pulse 77, temperature 98.5 F (36.9 C), temperature source Oral, resp. rate 16, height 4\' 11"  (1.499 m), weight 202 lb (91.6 kg), last menstrual period 12/30/2016, SpO2 100 %, unknown if currently breastfeeding.  Physical Exam:  General: alert oriented x 3 Lochia: appropriate Uterine Fundus: firm I DVT Evaluation: No redness edema swelling or pain in lower extremeties  Recent Labs    09/08/17 1825 09/10/17 0627  HGB 11.6* 11.5*  HCT 32.4* 32.1*    Assessment/Plan: Routine postpartum orders Plan discharge for tomorrow   LOS: 2 days   Elmore GuiseLori A Clemmons CNM 09/10/2017, 8:39 AM

## 2017-09-10 NOTE — Lactation Note (Signed)
This note was copied from a baby's chart. Lactation Consultation Note  Patient Name: Boy Harvin HazelBonita Bille ZOXWR'UToday's Date: 09/10/2017 Reason for consult: Follow-up assessment;Late-preterm 34-36.6wks;Difficult latch;Infant < 6lbs;Hyperbilirubinemia   Follow up with mom of 31 hour old LPT infant. Infant with 3 BF for 10-25 minuets, 1 BF Attempt, bottles x 4 of 6-15 ml, 2 voids and 4 stools in the last 24 hours.   Mom reports infant will latch to the left and not the right, she is planning to keep trying with infant. Mom is pumping about every 3 hours and following with hand expression, she is not able to express milk at this time but understands the importance of pumping for stimulation.   Enc mom to increase supplement to about 20 ml using breast milk first and followed with formula as needed. Mom voiced understanding.   Mom reports she has no questions/concerns at this time. MOm to call out for assistance as needed.    Maternal Data    Feeding Feeding Type: Breast Milk with Formula added Nipple Type: Slow - flow  LATCH Score                   Interventions    Lactation Tools Discussed/Used     Consult Status Consult Status: Follow-up Date: 09/11/17 Follow-up type: In-patient    Silas FloodSharon S Shamariah Shewmake 09/10/2017, 3:08 PM

## 2017-09-11 MED ORDER — MEDROXYPROGESTERONE ACETATE 150 MG/ML IM SUSP
150.0000 mg | Freq: Once | INTRAMUSCULAR | Status: AC
  Administered 2017-09-11: 150 mg via INTRAMUSCULAR
  Filled 2017-09-11: qty 1

## 2017-09-11 NOTE — Lactation Note (Signed)
This note was copied from a baby's chart. Lactation Consultation Note  Patient Name: [name] ZOXWR'UToday's Date: 09/11/2017  Baby is 50 hours and breastfeeding well per Mom.  Baby is also receiving neosure as supplement.  Mom continues to pump but milk is not in yet.  Mom is very comfortable with plan and has no questions at present.  Encouraged to continue putting baby to breast with cues, post pump and supplement expressed milk/formula per day of life guidelines.  Lactation outpatient services and support reviewed and encouraged prn.   Maternal Data    Feeding    LATCH Score                   Interventions    Lactation Tools Discussed/Used     Consult Status      Huston FoleyMOULDEN, [name] 09/11/2017, 9:37 AM

## 2017-09-11 NOTE — Discharge Summary (Signed)
OB Discharge Summary     Patient Name: Paula Livingston DOB: 02/19/1986 MRN: [id number]  Date of admission: 09/08/2017 Delivering MD: Illene Bolus A   Date of discharge: 09/11/2017  Admitting diagnosis: 36WKS [place] Intrauterine pregnancy: [redacted]w[redacted]d     Secondary diagnosis:  Active Problems:   Preterm premature rupture of membranes (PPROM) with unknown onset of labor  Additional problems: None     Discharge diagnosis: Term Pregnancy Delivered                                                                                                Post partum procedures:None  Augmentation: None  Complications: None  Hospital course:  Onset of Labor With Vaginal Delivery     32 y.o. yo [id number] at [redacted]w[redacted]d was admitted in Latent Labor on 09/08/2017. Patient had an uncomplicated labor course as follows:  Membrane Rupture Time/Date: 4:30 PM ,09/08/2017   Intrapartum Procedures: Episiotomy: None [1]                                         Lacerations:  None [1]  Patient had a delivery of a Viable infant. 09/09/2017  Information for the patient's newborn:  Sherrita, Riederer [[id number]]  Delivery Method: VBAC, Spontaneous(Filed from Delivery Summary)    Pateint had an uncomplicated postpartum course.  She is ambulating, tolerating a regular diet, passing flatus, and urinating well. Patient is discharged home in stable condition on 09/11/17.   Physical exam  Vitals:   09/09/17 1559 09/10/17 0612 09/10/17 1853 09/11/17 0530  BP: 129/74 123/84 136/84 119/80  Pulse: 82 77 74 76  Resp:  16 18 16   Temp: 98.4 F (36.9 C) 98.5 F (36.9 C) 98.4 F (36.9 C) 98.1 F (36.7 C)  TempSrc:  Oral Oral Oral  SpO2:      Weight:      Height:       General: alert, cooperative and no distress Lochia: appropriate Uterine Fundus: firm Incision: N/A DVT Evaluation: No evidence of DVT seen on physical exam. Negative Homan's sign. Labs: Lab Results  Component Value Date   WBC 7.1 09/10/2017   HGB 11.5 (L)  09/10/2017   HCT 32.1 (L) 09/10/2017   MCV 80.9 09/10/2017   PLT 165 09/10/2017   CMP Latest Ref Rng & Units 02/21/2017  Glucose 65 - 99 mg/dL 130(Q)  BUN 6 - 20 mg/dL 10  Creatinine 6.57 - 8.46 mg/dL 9.62  Sodium 952 - 841 mmol/L 135  Potassium 3.5 - 5.1 mmol/L 3.7  Chloride 101 - 111 mmol/L 106  CO2 22 - 32 mmol/L 22  Calcium 8.9 - 10.3 mg/dL 9.2  Total Protein 6.5 - 8.1 g/dL 7.5  Total Bilirubin 0.3 - 1.2 mg/dL 0.4  Alkaline Phos 38 - 126 U/L 30(L)  AST 15 - 41 U/L 15  ALT 14 - 54 U/L 12(L)    Discharge instruction: per After Visit Summary and "Baby and Me Booklet".  After visit meds:  Allergies as of 09/11/2017  No Known Allergies     Medication List    STOP taking these medications   calcium carbonate 500 MG chewable tablet Commonly known as:  TUMS - dosed in mg elemental calcium   NIFEdipine 20 MG capsule Commonly known as:  PROCARDIA     TAKE these medications   acetaminophen 325 MG tablet Commonly known as:  TYLENOL Take 650 mg by mouth every 6 (six) hours as needed for mild pain or headache.   prenatal multivitamin Tabs tablet Take 1 tablet by mouth daily at 12 noon.       Diet: routine diet  Activity: Advance as tolerated. Pelvic rest for 6 weeks.   Outpatient follow up:6 weeks Follow up Appt:No future appointments. Follow up Visit:No follow-ups on file.  Postpartum contraception: Depo Provera  Newborn Data: Live born female  Birth Weight: 6 lb 1.5 oz (2765 g) APGAR: 9, 9  Newborn Delivery   Birth date/time:  09/09/2017 07:20:00 Delivery type:  VBAC, Spontaneous     Baby Feeding: Bottle and Breast Disposition:home with mother   09/11/2017 Kenney HousemanNancy Jean Prothero, CNM

## 2017-09-12 ENCOUNTER — Ambulatory Visit: Payer: Self-pay

## 2017-09-12 NOTE — Lactation Note (Addendum)
This note was copied from a baby's chart. Lactation Consultation Note  Patient Name: Paula Livingston ZOXWR'UToday's Date: 09/12/2017 Reason for consult: Follow-up assessment   Baby cueing and mother giving baby pacifier.  Pacifier use not recommended at this time.  Discussed how the volume and frequent feeding will help reduce bilirubin. Mother states her nipples are sore so she has been pumping and bottle feeding. Her L nipple is cracked.  Both are sore and inverted. Provided mother w/ coconut oil and comfort gels to alternate with. She pumped approx 30 ml this morning which was given to baby with a slow flow nipple. Mother gave an additional 5 ml of formula. Mom encouraged to feed baby 8-12 times/24 hours and with feeding cues at least q 3 hours.  Reviewed engorgement care and monitoring voids/stools.    Maternal Data    Feeding Feeding Type: Breast Milk  LATCH Score                   Interventions    Lactation Tools Discussed/Used     Consult Status Consult Status: Complete    Paula Livingston, Paula Livingston 09/12/2017, 9:22 AM

## 2017-09-13 ENCOUNTER — Ambulatory Visit: Payer: Self-pay

## 2017-09-13 NOTE — Lactation Note (Signed)
This note was copied from a baby's chart. Lactation Consultation Note  Patient Name: Paula Harvin HazelBonita Bresee WUJWJ'XToday's Date: 09/13/2017 Reason for consult: Follow-up assessment   Baby 624 days old on phototherapy. Mother recently pumped approx 20 ml and is also supplementing w/ formula. Discussed waking baby for feedings if needed. Mom encouraged to feed baby 8-12 times/24 hours and with feeding cues.  Reviewed engorgement care and monitoring voids/stools.    Maternal Data    Feeding    LATCH Score                   Interventions    Lactation Tools Discussed/Used     Consult Status Consult Status: Complete Date: 09/13/17    Paula Livingston, [facility] 09/13/2017, 11:29 AM

## 2018-01-28 IMAGING — US US OB COMP LESS 14 WK
2 series · 15 of 28 positions shown · non-contrast
Comparison: None.

CLINICAL DATA: Vaginal bleeding during first trimester pregnancy.
Estimated gestational age by LMP is 7 weeks 4 days. Quantitative
beta HCG was not obtained.

EXAM:
OBSTETRIC <14 WK US AND TRANSVAGINAL OB US
TECHNIQUE: Both transabdominal and transvaginal ultrasound examinations were
performed for complete evaluation of the gestation as well as the
maternal uterus, adnexal regions, and pelvic cul-de-sac.
Transvaginal technique was performed to assess early pregnancy.

[Series 1: us ob comp less 14 wk · 74 acquisitions, 14 frames shown (1 of 2)]
[im 1/74]
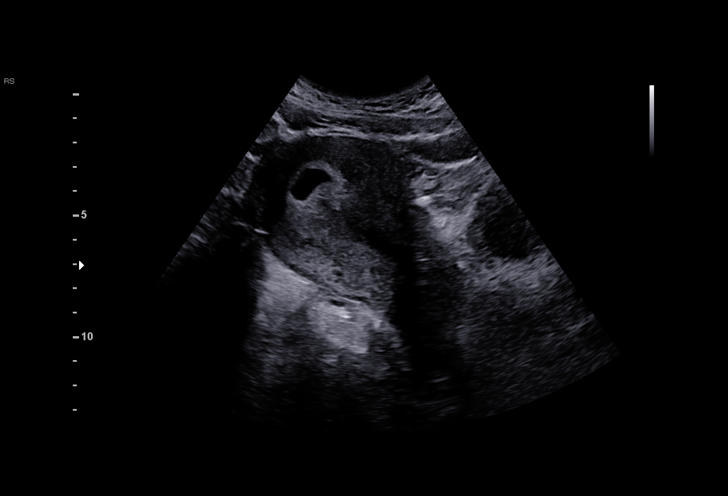
[im 6/74]
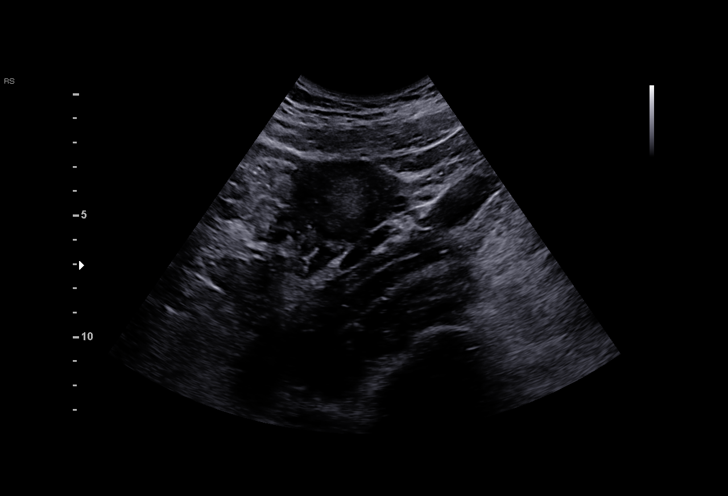
[im 12/74]
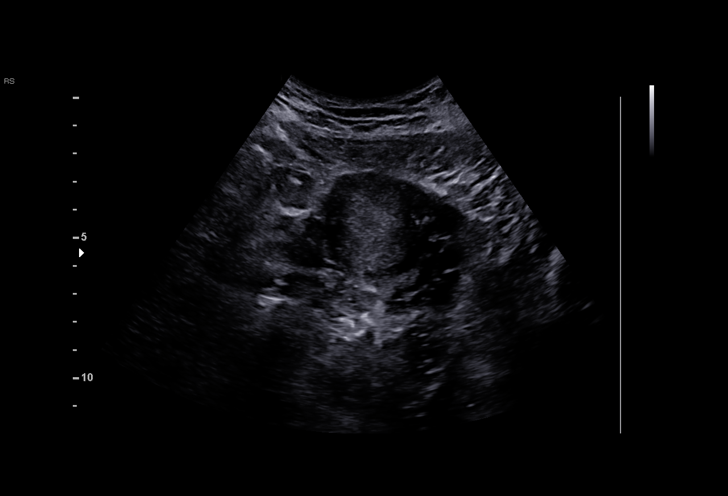
[im 17/74]
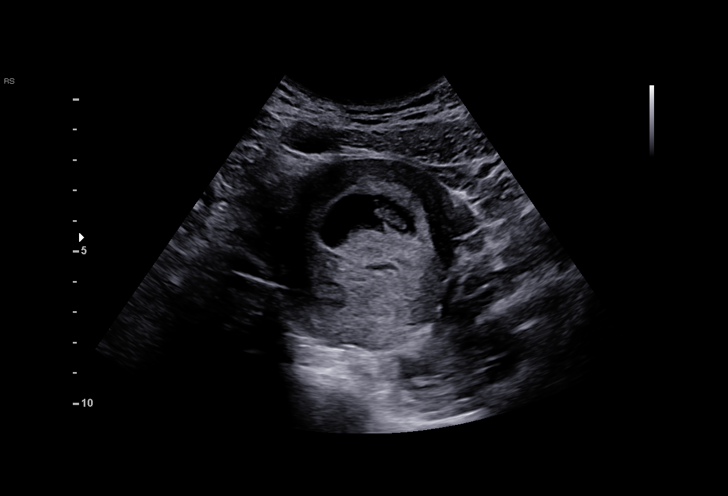
[im 23/74]
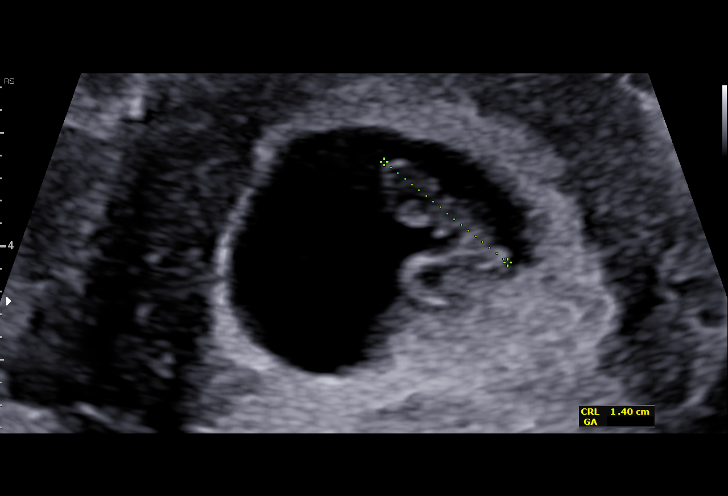
[im 29/74]
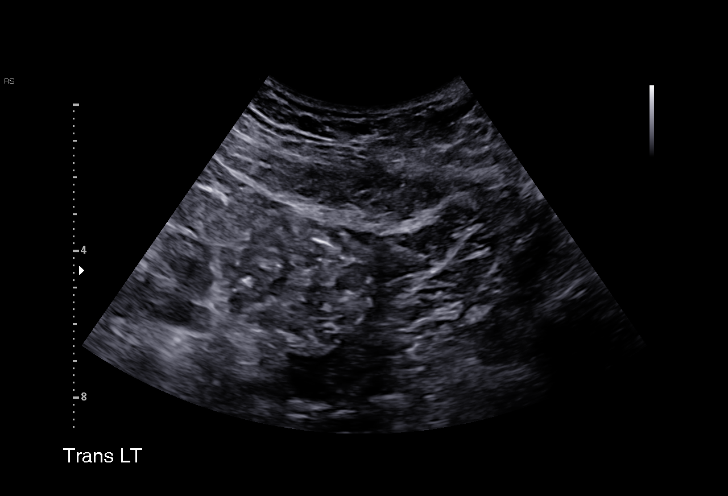
[im 34/74]
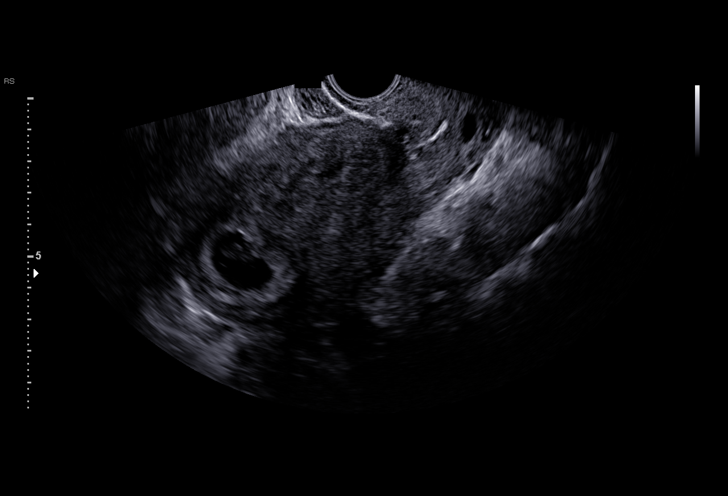
[im 40/74]
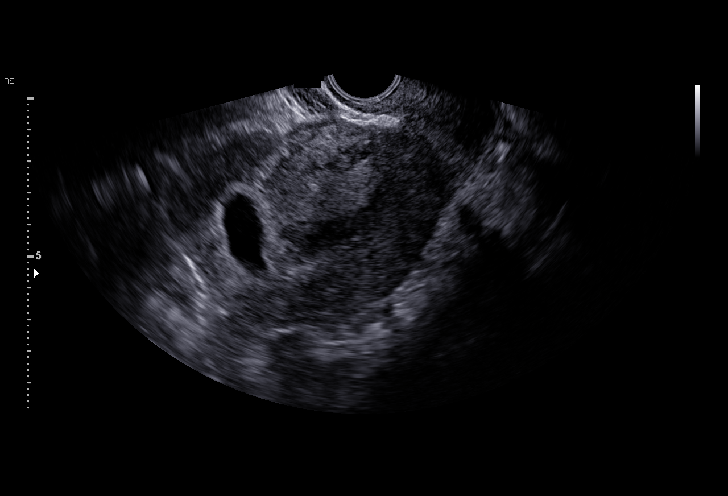
[im 43/74]
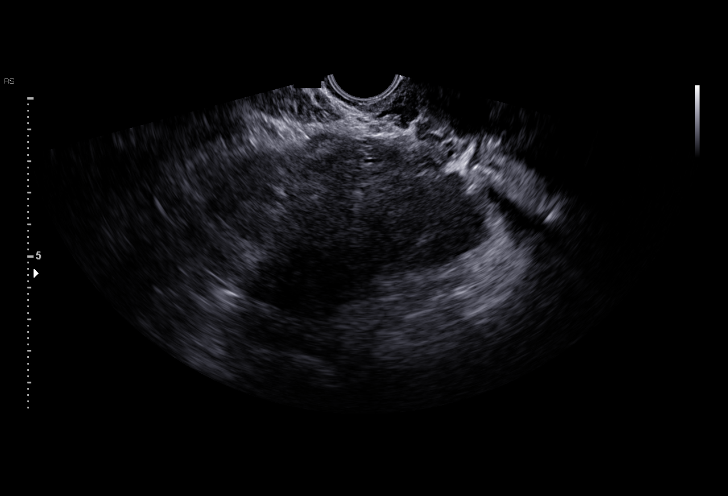
[im 48/74]
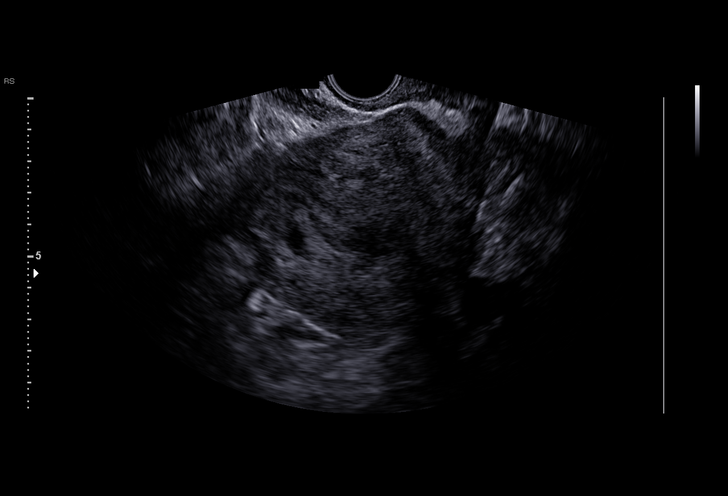
[im 54/74]
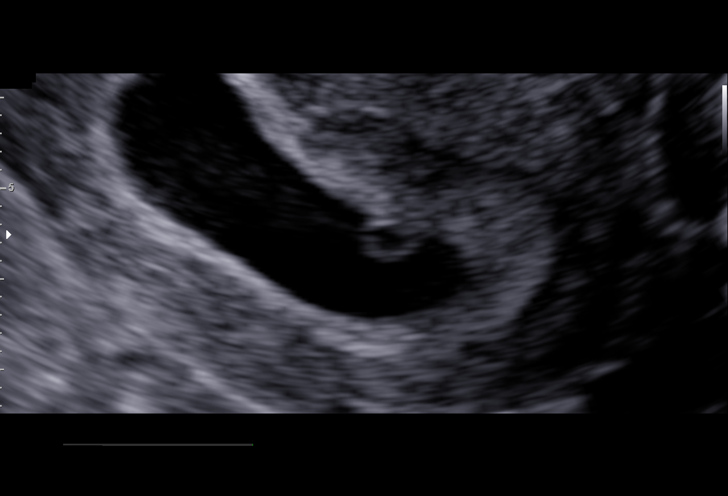
[im 59/74]
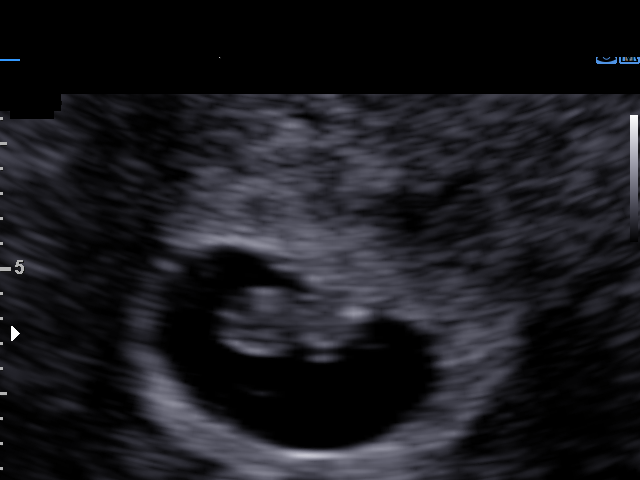
[im 65/74]
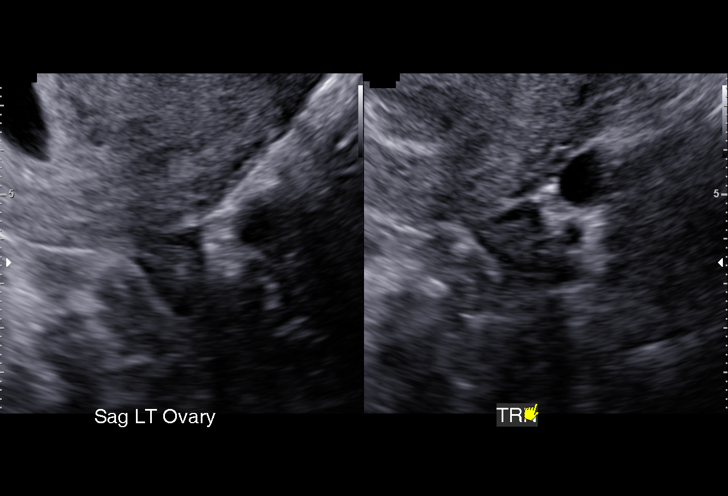
[im 71/74]
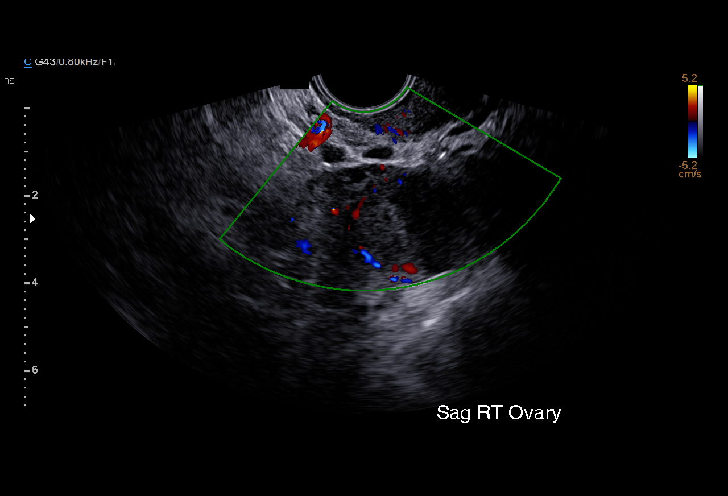

[Series 2: us ob comp less 14 wk · 1 of 1 slices shown (2 of 2)]
[im 1/1]
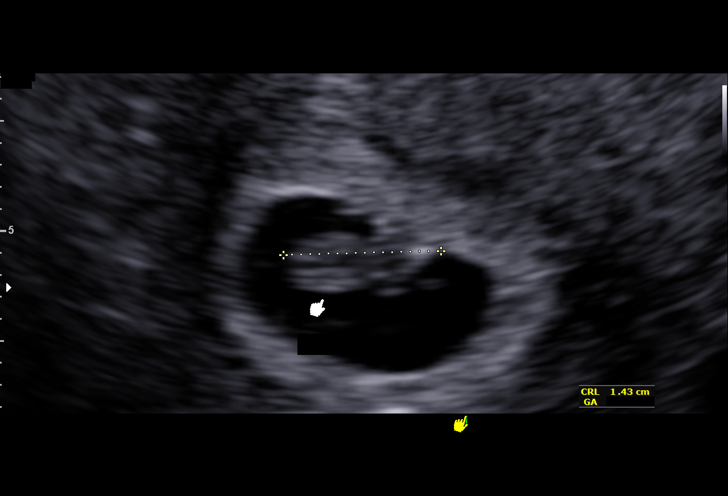

[15 of 28 positions shown; findings below may reference images not displayed]

FINDINGS: Intrauterine gestational sac: A single intrauterine gestational sac
is present.

Yolk sac:  Yolk sac is visualized.

Embryo:  Fetal pole is identified.

Cardiac Activity: Fetal cardiac activity is observed.

Heart Rate: 157  bpm

CRL:  13.9  mm   7 w   4 d                  US EDC: 10/06/2017

Subchorionic hemorrhage:  None visualized.

Maternal uterus/adnexae: Uterus is anteverted. No myometrial mass
lesion is identified. Small nabothian cysts in the cervix. Both
ovaries are visualized and appear unremarkable. No abnormal adnexal
masses. Small corpus luteal cyst on the right. No free fluid in the
abdomen.
IMPRESSION: Single intrauterine pregnancy. Estimated gestational age by
crown-rump length is 7 weeks 4 days. No acute complication
demonstrated sonographically.

## 2019-07-18 ENCOUNTER — Other Ambulatory Visit: Payer: Self-pay

## 2019-07-18 ENCOUNTER — Emergency Department (HOSPITAL_COMMUNITY)
Admission: EM | Admit: 2019-07-18 | Discharge: 2019-07-18 | Disposition: A | Discharge status: Home / Self Care | Payer: Medicaid Other | Attending: Emergency Medicine | Admitting: Emergency Medicine

## 2019-07-18 ENCOUNTER — Encounter (HOSPITAL_COMMUNITY): Payer: Self-pay

## 2019-07-18 DIAGNOSIS — Y9389 Activity, other specified: Secondary | ICD-10-CM | POA: Insufficient documentation

## 2019-07-18 DIAGNOSIS — Y999 Unspecified external cause status: Secondary | ICD-10-CM | POA: Insufficient documentation

## 2019-07-18 DIAGNOSIS — W260XXA Contact with knife, initial encounter: Secondary | ICD-10-CM | POA: Insufficient documentation

## 2019-07-18 DIAGNOSIS — S61211A Laceration without foreign body of left index finger without damage to nail, initial encounter: Secondary | ICD-10-CM

## 2019-07-18 DIAGNOSIS — Y929 Unspecified place or not applicable: Secondary | ICD-10-CM | POA: Insufficient documentation

## 2019-07-18 MED ORDER — LIDOCAINE HCL 2 % IJ SOLN
20.0000 mL | Freq: Once | INTRAMUSCULAR | Status: AC
  Administered 2019-07-18: 17:00:00 400 mg via INTRADERMAL
  Filled 2019-07-18: qty 20

## 2019-07-18 NOTE — ED Provider Notes (Signed)
[facility] EMERGENCY DEPARTMENT Provider Note   CSN: [id number] Arrival date & time: 07/18/19  1452     History No chief complaint on file.   Paula Livingston is a 34 y.o. female.  HPI Patient presents to the emergency department with laceration to the left index finger just above the DIP.  Patient states he was using a knife around 1 AM when she cut her finger.  The patient states that she kept it clean and dry until she came here today.  Patient states that nothing seems make her condition better or worse.  She states that is been no bleeding since the time after she applied pressure.  Patient denies any numbness or weakness in the finger or hand.    Past Medical History:  Diagnosis Date  . Infection 2005   gonorrhea  . Infection 2005   CHLAMYDIA  . Pregnancy induced hypertension 2007  . Preterm labor 2007    Patient Active Problem List   Diagnosis Date Noted  . Preterm premature rupture of membranes (PPROM) with unknown onset of labor 09/08/2017  . Preterm delivery 11/03/2013  . Postpartum care and examination immediately after delivery 11/01/2013  . Supervision of high risk pregnancy in second trimester 07/22/2013  . Low birth weight 07/20/2013  . Hx of gestational diabetes in prior pregnancy, currently pregnant 07/20/2013  . Hx of preterm delivery, currently pregnant 07/20/2013  . History of congenital abnormalities 07/20/2013  . Obesity 07/20/2013  . Late prenatal care 07/20/2013  . 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 VBAC (vaginal birth after Cesarean) 10/27/2012  . Previous cesarean delivery, delivered, with or without mention of antepartum condition 06/16/2012  . Hx of pre-eclampsia in prior pregnancy, currently pregnant 06/16/2012    Past Surgical History:  Procedure Laterality Date  . CESAREAN SECTION       OB History    Gravida  5   Para  5   Term  2   Preterm  3   AB  0   Living  5     SAB  0   TAB  0   Ectopic  0   Multiple  0   Live Births  5        Obstetric Comments  2007-IOL FOR PREECLAMPSIA 2008 C/S FETAL DISTRESS        Family History  Problem Relation Age of Onset  . Alcohol abuse Mother   . Drug abuse Mother   . Aneurysm Father        BRAIN  . Cancer Maternal Aunt 52       BREAST  . Cancer Maternal Uncle        PROSTATE  . Diabetes Maternal Uncle   . Alcohol abuse Maternal Uncle   . Drug abuse Maternal Uncle   . Cancer Maternal Uncle        PROSTATE  . Alcohol abuse Maternal Uncle   . Drug abuse Maternal Uncle   . Cancer Maternal Uncle        BRAIN  . Alcohol abuse Maternal Uncle   . Drug abuse Maternal Uncle   . Alcohol abuse Sister   . Alcohol abuse Brother   . Asthma Son   . Birth defects Son        EXTRA DIGITS  . Seizures Son        FEBRILE  . Other Neg Hx     Social History   Tobacco Use  . Smoking status: Never Smoker  .  Smokeless tobacco: Never Used  Substance Use Topics  . Alcohol use: No  . Drug use: No    Home Medications Prior to Admission medications   Medication Sig Start Date End Date Taking? Authorizing Provider  acetaminophen (TYLENOL) 325 MG tablet Take 650 mg by mouth every 6 (six) hours as needed for mild pain or headache.    [provider]  Prenatal Vit-Fe Fumarate-FA (PRENATAL MULTIVITAMIN) TABS tablet Take 1 tablet by mouth daily at 12 noon.    [provider]    Allergies    Patient has no known allergies.  Review of Systems   Review of Systems All other systems negative except as documented in the HPI. All pertinent positives and negatives as reviewed in the HPI.  Physical Exam Updated Vital Signs BP 139/90 (BP Location: Right Arm)   Pulse 75   Temp 98.2 F (36.8 C) (Oral)   Resp 16   SpO2 100%   Physical Exam Vitals and nursing note reviewed.  Constitutional:      General: She is not in acute distress.    Appearance: She is well-developed.  HENT:     Head: Normocephalic and atraumatic.  Eyes:       Pupils: Pupils are equal, round, and reactive to light.  Pulmonary:     Effort: Pulmonary effort is normal.  Musculoskeletal:     Left hand: Laceration present. No swelling. Normal range of motion. Normal strength. Normal sensation. Normal capillary refill.       Hands:  Skin:    General: Skin is warm and dry.  Neurological:     Mental Status: She is alert and oriented to person, place, and time.     ED Results / Procedures / Treatments   Labs (all labs ordered are listed, but only abnormal results are displayed) Labs Reviewed - No data to display  EKG None  Radiology No results found.  Procedures Procedures (including critical care time)  Medications Ordered in ED Medications  lidocaine (XYLOCAINE) 2 % (with pres) injection 400 mg (has no administration in time range)    ED Course  I have reviewed the triage vital signs and the nursing notes.  Pertinent labs & imaging results that were available during my care of the patient were reviewed by me and considered in my medical decision making (see chart for details).    MDM Rules/Calculators/A&P                      LACERATION REPAIR Performed by: Carlyle Dolly Authorized by: Jamesetta Orleans Herberta Pickron Consent: Verbal consent obtained. Risks and benefits: risks, benefits and alternatives were discussed Consent given by: patient Patient identity confirmed: provided demographic data Prepped and Draped in normal sterile fashion Wound explored  Laceration Location: Left index finger just above the DIP joint palmar side.   Laceration Length: 3 cm  No Foreign Bodies seen or palpated  Anesthesia: local infiltration  Local anesthetic: lidocaine 2% without epinephrine  Anesthetic total: 4 ml  Irrigation method: syringe Amount of cleaning: standard  Skin closure: 4-0 Prolene  Number of sutures: 4  Technique: Simple interrupted with loose approximation due to the time after the wound.  Patient  tolerance: Patient tolerated the procedure well with no immediate complications.   Patient's finger was soaked and copiously irrigated.  I advised the patient that there is an increased risk of infection but we loosely approximated the wound and to watch for signs of infection.  I did advise  her to return here for any signs of increased swelling redness warmth or drainage from the wound.  Patient agrees to this plan and all questions were answered. Final Clinical Impression(s) / ED Diagnoses Final diagnoses:  None    Rx / DC Orders ED Discharge Orders    None       Dalia Heading, PA-C 07/18/19 1712    Charlesetta Shanks, MD 07/19/19 1606

## 2019-07-18 NOTE — Discharge Instructions (Addendum)
Have sutures out in 10 to 14 days.  Keep the area clean and dry with warm water and soap.  Do not apply any Neosporin to the area.  If you notice any signs of infection such as redness increased swelling increased warmth or drainage from the wound you will need to be rechecked here.  Tylenol and Motrin for any pain.

## 2019-07-18 NOTE — ED Triage Notes (Signed)
Patient here with laceration to left index finger that occurred this am with kitchen knife 0100. No bleeding, NAD

## 2021-06-03 NOTE — L&D Delivery Note (Signed)
OB/GYN Faculty Practice Delivery Note  Paula Livingston is a 36 y.o. [id number] s/p VD at [redacted]w[redacted]d. She was admitted for IOL due to pre-eclampsia with severe features.   ROM: 2h 18m with clear fluid GBS Status: NEGATIVE/-- (06/06 1126) Maximum Maternal Temperature: 98.80F  Labor Progress: Initial SVE: 2/60/-2. She then progressed to complete with Pit and AROM.   Delivery Date/Time: 2019  Delivery: Called to room quickly as patient was uncontrollably pushing with head almost crowning. Head delivered R OA. No nuchal cord present, but cord around right foot. Shoulder and body delivered in usual fashion. Infant with spontaneous cry, placed on mother's abdomen, dried and stimulated. Cord clamped x 2 after >1-minute delay, and cut by FOB. Cord blood drawn. Placenta delivered spontaneously with gentle cord traction. Fundus firm with massage and Pitocin. Prophylactic TXA given. Labia, perineum, vagina, and cervix inspected with no lacerations.  Baby Weight: pending  Placenta: 3 vessel, intact. Sent to L&D Complications: None Lacerations:  EBL: 50 mL Analgesia: Epidural (shortly before delivery)    Infant:  APGAR (1 MIN): 9   APGAR (5 MINS): 9    Leticia Penna, DO  OB Family Medicine Fellow, [facility] for [facility], [facility] 11/06/2021, 8:37 PM

## 2021-06-19 ENCOUNTER — Other Ambulatory Visit: Payer: Self-pay

## 2021-06-22 ENCOUNTER — Other Ambulatory Visit: Payer: Self-pay

## 2021-06-22 ENCOUNTER — Ambulatory Visit: Payer: Medicaid Other | Attending: Family

## 2021-06-22 ENCOUNTER — Ambulatory Visit: Payer: Self-pay

## 2021-06-22 DIAGNOSIS — Z141 Cystic fibrosis carrier: Secondary | ICD-10-CM

## 2021-06-22 NOTE — Progress Notes (Signed)
Name: Paula Livingston Indication: Discuss carrier screening results Personal and family history of polydactyly  DOB: 1985-12-31 Age: 36 y.o.   EDC: 12/15/2021 LMP: Not known Referring Provider:  [facility] Department  EGA: [redacted]w[redacted]d Genetic Counselor: Staci Righter, MS, CGC  OB Hx: [id number] Date of Appointment: 06/22/2021  Accompanied by: Unaccompanied Face to Face Time: 26 Minutes   Previous Testing Completed: Paula Livingston previously completed Non-Invasive Prenatal Screening (NIPS) in this pregnancy, however, the NIPS was calculated using an incorrect due date of 10/27/2021. Genetic counseling emailed Paula Livingston a Change in [name] today (06/22/2021) correcting the due date to 12/15/2021. We will contact Paula Livingston when the recalculated result is available.  Paula Livingston previously completed carrier screening (scanned into Epic under the Media tab). She screened to be a carrier for Paula Livingston. She screened to have an increased chance to be a carrier for Paula Livingston. She screened to not be a carrier for alpha thalassemia and beta hemoglobinopathies. A negative result on carrier screening reduces the likelihood of being a carrier, however, does not entirely rule out the possibility.   Medical History:  This is Paula Livingston's 6th pregnancy. She has 5 living children. Reports she takes prenatal vitamins. Denies personal history of diabetes, high blood pressure, thyroid conditions, and seizures. Denies bleeding, infections, and fevers in this pregnancy. Denies using tobacco, alcohol, or street drugs in this pregnancy.   Family History: A pedigree was created and scanned into Epic under the Media tab. 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 She reports her 59 year old son had polydactyly on his right hand. She reports her 80 year old son had polydactyly on his right hand. This 72 year old son is currently being evaluated for Autism but has not been formally  diagnosed. Maternal ethnicity reported as Research scientist (physical sciences) and paternal ethnicity reported as Research scientist (physical sciences). Denies Ashkenazi Jewish ancestry. Family history not remarkable for consanguinity,intellectual disability, multiple spontaneous abortions, still births, or unexplained neonatal death.     Genetic Counseling:   Carrier for Paula Livingston. 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 CF is an autosomal recessive condition caused by mutations in the gene CFTR. CF is a multisystem disease that involves the respiratory tract, exocrine pancreas, intestine, hepatobiliary system, female genital tract, and exocrine sweat glands. Morbidities include progressive obstructive lung disease with bronchiectasis, frequent hospitalizations for pulmonary disease, pancreatic insufficiency and malnutrition, recurrent sinusitis and bronchitis, and female infertility. The overall median predicted survival is estimated at 40.7 years. CF has not been shown to be associated with intellectual disability. Because Paula Livingston is a known carrier for CF, carrier screening for her reproductive partner is recommended to determine risk for the current pregnancy. We reviewed with Paula Livingston that if her partner is found to also be a carrier for CF, there would be a 25% chance the current pregnancy and all future pregnancies would be affected. Paula Livingston verbalized understanding of the information discussed and declined carrier screening for CF on her partner's behalf. Genetic counseling reviewed with Paula Livingston that CF is included on Paula Livingston Why's newborn screening program.  Increased Risk to be a Silent Carrier of Paula Livingston (Paula Livingston). Paula Livingston is an autosomal recessive disease caused by loss of function mutations in the SMN1 gene. [name] screened to have two functional copies of the SMN1 gene, however, due to limitations of genetic screening the laboratory cannot  confirm if Paula Livingston's two copies are on the same chromosome or on opposite chromosomes. The laboratory  identified the variant [id number] in Paula Livingston's screening, meaning there is increased risk for Paula Livingston's two copies of the SMN1 gene to be on the same chromosome. Specifically, given Paula Livingston's ethnic background, her risk to be a silent carrier is approximately 1 in 58. Genetic counseling reviewed with Paula Livingston that if her two copies are on the same chromosome that means her other chromosome would have zero copies, and there could be risk for an affected pregnancy if her reproductive partner is found to be a carrier for Paula Livingston. Individuals with Paula Livingston have progressive, proximal, and symmetrical muscle weakness and Livingston due to degeneration of motor neurons of the spine and brainstem. Symptoms can first appear at any time between before birth to adulthood. There are five types of Paula Livingston that are historically classified based on clinical presentation, onset of symptoms, and the maximum skill level of motor milestones achieved, however, there is a lot of overlap between types. There are several treatments available for individuals affected with Paula Livingston and studies show that treatment is most effective when it is started in the first few months of life. Given Paula Livingston's carrier screening result, genetic counseling recommended screening her reproductive partner for Paula Livingston. Paula Livingston verbalized understanding of the information discussed and declined carrier screening for Paula Livingston on her partner's behalf. Genetic counseling reviewed with [name] that Paula Livingston is included on Paula Livingston Winger's newborn screening program.  Personal and Family History of Polydactyly. 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 She reports her 53 year old son had polydactyly on his right hand. She reports her 55 year old son had polydactyly on his right hand. This 88 year old son is currently being evaluated for Autism but has not been formally diagnosed. Polydactyly can  be seen in isolation or alongside other congenital birth defects. When seen in isolation, polydactyly is typically inherited in an autosomal dominant manner with incomplete penetrance. In general, all babies have approximately a 3-5% risk for a birth defect and many of these defects cannot be detected through the screening or diagnostic testing listed below. Ultrasound may detect some birth defects, but it may not detect all birth defects. A normal ultrasound does not guarantee a healthy pregnancy.     Patient Plan:  Proceed with: Routine prenatal care including a recalculation of the NIPS result. Mahia is going to discuss the option of carrier screening with her reproductive partner. Genetic counseling encouraged Alianny to call the [facility] if her partner opts to pursue this screening.  Informed consent was obtained. All questions were answered.  Declined: Carrier Screening   Thank you for sharing in the care of [place] with [place].  Please do not hesitate to contact us if you have any questions.  Staci Righter, MS, [facility]

## 2021-06-26 ENCOUNTER — Other Ambulatory Visit: Payer: Self-pay

## 2021-06-27 ENCOUNTER — Telehealth: Payer: Self-pay | Admitting: Genetics

## 2021-06-27 NOTE — Telephone Encounter (Signed)
Barry was contacted by telephone to review their noninvasive prenatal screening (NIPS) result. The result is low risk, consistent. This screening significantly reduces the risk that the current pregnancy has Down syndrome, Trisomy 58, Trisomy 13, Monosomy X, and Triploidy. Ajayla understands that this is a screening and not a diagnostic test. All questions answered.

## 2021-07-10 ENCOUNTER — Other Ambulatory Visit: Payer: Self-pay

## 2021-07-10 ENCOUNTER — Ambulatory Visit (INDEPENDENT_AMBULATORY_CARE_PROVIDER_SITE_OTHER): Payer: Medicaid Other | Admitting: Obstetrics & Gynecology

## 2021-07-10 ENCOUNTER — Encounter: Payer: Self-pay | Admitting: Obstetrics & Gynecology

## 2021-07-10 VITALS — BP 132/81 | HR 96 | Wt 205.0 lb

## 2021-07-10 DIAGNOSIS — O09899 Supervision of other high risk pregnancies, unspecified trimester: Secondary | ICD-10-CM

## 2021-07-10 DIAGNOSIS — Z3A17 17 weeks gestation of pregnancy: Secondary | ICD-10-CM

## 2021-07-10 DIAGNOSIS — Z3480 Encounter for supervision of other normal pregnancy, unspecified trimester: Secondary | ICD-10-CM

## 2021-07-10 DIAGNOSIS — O09292 Supervision of pregnancy with other poor reproductive or obstetric history, second trimester: Secondary | ICD-10-CM

## 2021-07-10 DIAGNOSIS — Z148 Genetic carrier of other disease: Secondary | ICD-10-CM

## 2021-07-10 DIAGNOSIS — O0992 Supervision of high risk pregnancy, unspecified, second trimester: Secondary | ICD-10-CM

## 2021-07-10 DIAGNOSIS — O0990 Supervision of high risk pregnancy, unspecified, unspecified trimester: Secondary | ICD-10-CM | POA: Insufficient documentation

## 2021-07-10 DIAGNOSIS — O09892 Supervision of other high risk pregnancies, second trimester: Secondary | ICD-10-CM

## 2021-07-10 DIAGNOSIS — O09299 Supervision of pregnancy with other poor reproductive or obstetric history, unspecified trimester: Secondary | ICD-10-CM

## 2021-07-10 DIAGNOSIS — Z141 Cystic fibrosis carrier: Secondary | ICD-10-CM

## 2021-07-10 MED ORDER — ASPIRIN EC 81 MG PO TBEC: 81.0000 mg | DELAYED_RELEASE_TABLET | Freq: Every day | ORAL | 2 refills | Status: AC

## 2021-07-10 NOTE — Progress Notes (Signed)
Subjective:    Paula Livingston is a [id number] [redacted]w[redacted]d being seen today for her first obstetrical visit.  Her obstetrical history is significant for pregnancy induced hypertension and pre-eclampsia. Patient does intend to breast feed. Pregnancy history fully reviewed.  Patient reports no complaints.  Vitals:   07/10/21 1341  BP: 132/81  Pulse: 96  Weight: 205 lb (93 kg)    HISTORY: OB History  Gravida Para Term Preterm AB Living  6 5 2 3  0 5  SAB IAB Ectopic Multiple Live Births  0 0 0 0 5    # Outcome Date GA Lbr Len/2nd Weight Sex Delivery Anes PTL Lv  6 Current           5 Preterm 09/09/17 [redacted]w[redacted]d / 00:05 6 lb 1.5 oz (2.765 kg) M VBAC EPI  LIV  4 Preterm 11/01/13 [redacted]w[redacted]d 00:33 / 02:04 5 lb 11.5 oz (2.594 kg) M VBAC None  LIV  3 Term 10/27/12 [redacted]w[redacted]d 05:16 / 00:02 5 lb 7.8 oz (2.49 kg) F VBAC EPI  LIV  2 Term 03/30/07 [redacted]w[redacted]d   F CS-Unspec EPI N LIV     Birth Comments: heart rate was dropping when pushing- was wrapped up in cord  1 Preterm 03/29/06 [redacted]w[redacted]d 24:00 4 lb 6 oz (1.984 kg) M Vag-Spont EPI N LIV     Birth Comments: induced due to [facility]    Obstetric Comments  2007-IOL FOR PREECLAMPSIA  2008 C/S FETAL DISTRESS   Past Medical History:  Diagnosis Date   Infection 2005   gonorrhea   Infection 2005   [place]   Pregnancy induced hypertension 2007   Preterm labor 2007   Past Surgical History:  Procedure Laterality Date   CESAREAN SECTION     Family History  Problem Relation Age of Onset   Alcohol abuse Mother    Drug abuse Mother    Aneurysm Father        BRAIN   Cancer Maternal Aunt 6       BREAST   Cancer Maternal Uncle        PROSTATE   Diabetes Maternal Uncle    Alcohol abuse Maternal Uncle    Drug abuse Maternal Uncle    Cancer Maternal Uncle        PROSTATE   Alcohol abuse Maternal Uncle    Drug abuse Maternal Uncle    Cancer Maternal Uncle        BRAIN   Alcohol abuse Maternal Uncle    Drug abuse Maternal Uncle    Alcohol abuse Sister    Alcohol abuse  Brother    Asthma Son    Birth defects Son        EXTRA DIGITS   Seizures Son        FEBRILE   Other Neg Hx      Exam    Uterus:   18 week size  Pelvic Exam:                               System:     Skin: normal coloration and turgor, no rashes    Neurologic: normal, normal mood   Extremities: no deformities   HEENT PERRLA   Mouth/Teeth mucous membranes moist, pharynx normal without lesions   Neck supple   Cardiovascular: regular rate and rhythm   Respiratory:  appears well, vitals normal, no respiratory distress, acyanotic, normal RR   Abdomen: soft, non-tender; bowel sounds normal; no  masses,  no organomegaly   Urinary:       Assessment:    Pregnancy: [id number] Patient Active Problem List   Diagnosis Date Noted   Supervision of high risk pregnancy, antepartum 07/10/2021   Preterm premature rupture of membranes (PPROM) with unknown onset of labor 09/08/2017   Preterm delivery 11/03/2013   Postpartum care and examination immediately after delivery 11/01/2013   Low birth weight 07/20/2013   Hx of gestational diabetes in prior pregnancy, currently pregnant 07/20/2013   Hx of preterm delivery, currently pregnant 07/20/2013   History of congenital abnormalities 07/20/2013   Obesity 07/20/2013   Late prenatal care 07/20/2013   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 Prenatal vitamins. Problem list reviewed and updated. Genetic Screening discussed : results reviewed.  Ultrasound discussed; fetal survey: ordered.  Follow up in 4 weeks. 50% of 30 min visit spent on counseling and coordination of care.  Recommend ASA 81 mg daily   Emeterio Reeve 07/10/2021

## 2021-07-12 LAB — AFP, SERUM, OPEN SPINA BIFIDA
AFP MoM: 1.11
AFP Value: 41.8 ng/mL
Gest. Age on Collection Date: 17.3 weeks
Maternal Age At EDD: 36.1 yr
OSBR Risk 1 IN: 10000
Test Results:: NEGATIVE
Weight: 205 [lb_av]

## 2021-07-24 ENCOUNTER — Encounter: Payer: Self-pay | Admitting: *Deleted

## 2021-07-24 ENCOUNTER — Other Ambulatory Visit: Payer: Self-pay | Admitting: *Deleted

## 2021-07-24 ENCOUNTER — Other Ambulatory Visit: Payer: Self-pay

## 2021-07-24 ENCOUNTER — Ambulatory Visit: Payer: Medicaid Other | Admitting: *Deleted

## 2021-07-24 ENCOUNTER — Ambulatory Visit: Payer: Medicaid Other | Attending: Obstetrics & Gynecology

## 2021-07-24 VITALS — BP 125/65 | HR 88

## 2021-07-24 DIAGNOSIS — O0990 Supervision of high risk pregnancy, unspecified, unspecified trimester: Secondary | ICD-10-CM | POA: Insufficient documentation

## 2021-07-24 DIAGNOSIS — O99212 Obesity complicating pregnancy, second trimester: Secondary | ICD-10-CM

## 2021-07-24 DIAGNOSIS — O09522 Supervision of elderly multigravida, second trimester: Secondary | ICD-10-CM

## 2021-07-24 DIAGNOSIS — O34219 Maternal care for unspecified type scar from previous cesarean delivery: Secondary | ICD-10-CM

## 2021-07-24 DIAGNOSIS — Z8632 Personal history of gestational diabetes: Secondary | ICD-10-CM

## 2021-08-07 ENCOUNTER — Other Ambulatory Visit: Payer: Self-pay

## 2021-08-07 ENCOUNTER — Ambulatory Visit (INDEPENDENT_AMBULATORY_CARE_PROVIDER_SITE_OTHER): Payer: Medicaid Other | Admitting: Obstetrics & Gynecology

## 2021-08-07 ENCOUNTER — Other Ambulatory Visit (HOSPITAL_COMMUNITY)
Admission: RE | Admit: 2021-08-07 | Discharge: 2021-08-07 | Disposition: A | Discharge status: Home / Self Care | Payer: Medicaid Other | Source: Ambulatory Visit | Attending: Obstetrics & Gynecology | Admitting: Obstetrics & Gynecology

## 2021-08-07 VITALS — BP 126/76 | HR 88 | Wt 207.0 lb

## 2021-08-07 DIAGNOSIS — O0990 Supervision of high risk pregnancy, unspecified, unspecified trimester: Secondary | ICD-10-CM

## 2021-08-07 DIAGNOSIS — O09899 Supervision of other high risk pregnancies, unspecified trimester: Secondary | ICD-10-CM

## 2021-08-07 DIAGNOSIS — O09299 Supervision of pregnancy with other poor reproductive or obstetric history, unspecified trimester: Secondary | ICD-10-CM

## 2021-08-07 DIAGNOSIS — O34219 Maternal care for unspecified type scar from previous cesarean delivery: Secondary | ICD-10-CM

## 2021-08-07 DIAGNOSIS — Z8632 Personal history of gestational diabetes: Secondary | ICD-10-CM

## 2021-08-07 MED ORDER — BLOOD PRESSURE KIT DEVI: 1.0000 | 0 refills | Status: AC

## 2021-08-07 NOTE — Progress Notes (Signed)
ROB, has not had any PNL only the Genetic screening and [place]. ?

## 2021-08-07 NOTE — Progress Notes (Signed)
? ?  PRENATAL VISIT NOTE ? ?Subjective:  ?Paula Livingston is a 36 y.o. G6P2305 at [redacted]w[redacted]d being seen today for ongoing prenatal care.  She is currently monitored for the following issues for this high-risk pregnancy and has Hx of pre-eclampsia in prior pregnancy, currently pregnant; VBAC (vaginal birth after Cesarean); Low birth weight; Hx of gestational diabetes in prior pregnancy, currently pregnant; Hx of preterm delivery, currently pregnant; History of congenital abnormalities; Obesity; Cystic fibrosis carrier; and Supervision of high risk pregnancy, antepartum on their problem list. ? ?Patient reports no complaints.  Contractions: Not present. Vag. Bleeding: None.  Movement: Present. Denies leaking of fluid.  ? ?The following portions of the patient's history were reviewed and updated as appropriate: allergies, current medications, past family history, past medical history, past social history, past surgical history and problem list.  ? ?Objective:  ? ?Vitals:  ? 08/07/21 1016  ?BP: 126/76  ?Pulse: 88  ?Weight: 207 lb (93.9 kg)  ? ? ?Fetal Status: Fetal Heart Rate (bpm): 141   Movement: Present    ? ?General:  Alert, oriented and cooperative. Patient is in no acute distress.  ?Skin: Skin is warm and dry. No rash noted.   ?Cardiovascular: Normal heart rate noted  ?Respiratory: Normal respiratory effort, no problems with respiration noted  ?Abdomen: Soft, gravid, appropriate for gestational age.  Pain/Pressure: Absent     ?Pelvic: Cervical exam performed in the presence of a chaperone        ?Extremities: Normal range of motion.  Edema: None  ?Mental Status: Normal mood and affect. Normal behavior. Normal judgment and thought content.  ? ?Assessment and Plan:  ?Pregnancy: G6P2305 at [redacted]w[redacted]d ?1. Supervision of high risk pregnancy, antepartum ?Prenatal labs ?- Blood Pressure Monitoring (BLOOD PRESSURE KIT) DEVI; 1 kit by Does not apply route once a week. Check Blood Pressure regularly and record readings into the  Babyscripts App.  Large Cuff.  DX O90.0  Dispense: 1 each; Refill: 0 ?- CBC/D/Plt+RPR+Rh+ABO+RubIgG... ?- Culture, OB Urine ?- HgB A1c ?- Cytology - PAP( [place]) ?- Cervicovaginal ancillary only( [place]) ? ?2. Hx of gestational diabetes in prior pregnancy, currently pregnant ?Screen at 28 weeks ? ?3. Hx of preterm delivery, currently pregnant ?F/u US  ? ?4. VBAC (vaginal birth after Cesarean) ? ? ?5. Hx of pre-eclampsia in prior pregnancy, currently pregnant ?ASA 81 mg ? ?Preterm labor symptoms and general obstetric precautions including but not limited to vaginal bleeding, contractions, leaking of fluid and fetal movement were reviewed in detail with the patient. ?Please refer to After Visit Summary for other counseling recommendations.  ? ?Return in about 4 weeks (around 09/04/2021). ? ?Future Appointments  ?Date Time Provider Department Center  ?08/23/2021 10:45 AM [facility] NURSE [facility]  ?08/23/2021 11:00 AM [facility] US1 [facility]  ? ? ?James Arnold, MD ? ?

## 2021-08-08 LAB — CBC/D/PLT+RPR+RH+ABO+RUBIGG...
Antibody Screen: NEGATIVE
Basophils Absolute: 0 10*3/uL (ref 0.0–0.2)
Basos: 0 %
EOS (ABSOLUTE): 0.1 10*3/uL (ref 0.0–0.4)
Eos: 1 %
HCV Ab: NONREACTIVE
HIV Screen 4th Generation wRfx: NONREACTIVE
Hematocrit: 35.5 % (ref 34.0–46.6)
Hemoglobin: 12 g/dL (ref 11.1–15.9)
Hepatitis B Surface Ag: NEGATIVE
Immature Grans (Abs): 0.1 10*3/uL (ref 0.0–0.1)
Immature Granulocytes: 1 %
Lymphocytes Absolute: 1.8 10*3/uL (ref 0.7–3.1)
Lymphs: 26 %
MCH: 29 pg (ref 26.6–33.0)
MCHC: 33.8 g/dL (ref 31.5–35.7)
MCV: 86 fL (ref 79–97)
Monocytes Absolute: 0.6 10*3/uL (ref 0.1–0.9)
Monocytes: 8 %
Neutrophils Absolute: 4.5 10*3/uL (ref 1.4–7.0)
Neutrophils: 64 %
Platelets: 255 10*3/uL (ref 150–450)
RBC: 4.14 x10E6/uL (ref 3.77–5.28)
RDW: 13.2 % (ref 11.7–15.4)
RPR Ser Ql: NONREACTIVE
Rh Factor: POSITIVE
Rubella Antibodies, IGG: 1.43 index (ref >0.99)
WBC: 6.9 10*3/uL (ref 3.4–10.8)

## 2021-08-08 LAB — CERVICOVAGINAL ANCILLARY ONLY
Bacterial Vaginitis (gardnerella): POSITIVE — AB
Candida Glabrata: NEGATIVE
Candida Vaginitis: NEGATIVE
Chlamydia: NEGATIVE
Comment: NEGATIVE
Comment: NEGATIVE
Comment: NEGATIVE
Comment: NEGATIVE
Comment: NEGATIVE
Comment: NORMAL
Neisseria Gonorrhea: NEGATIVE
Trichomonas: NEGATIVE

## 2021-08-08 LAB — HEMOGLOBIN A1C
Est. average glucose Bld gHb Est-mCnc: 97 mg/dL
Hgb A1c MFr Bld: 5 % (ref 4.8–5.6)

## 2021-08-08 LAB — HCV INTERPRETATION

## 2021-08-09 LAB — CYTOLOGY - PAP
Comment: NEGATIVE
Diagnosis: NEGATIVE
High risk HPV: NEGATIVE

## 2021-08-09 LAB — URINE CULTURE, OB REFLEX

## 2021-08-09 LAB — CULTURE, OB URINE

## 2021-08-09 MED ORDER — METRONIDAZOLE 500 MG PO TABS: 500.0000 mg | ORAL_TABLET | Freq: Two times a day (BID) | ORAL | 0 refills | Status: DC

## 2021-08-09 NOTE — Addendum Note (Signed)
Addended by: Adam Phenix on: 08/09/2021 04:03 PM ? ? Modules accepted: Orders ? ?

## 2021-08-23 ENCOUNTER — Ambulatory Visit: Payer: Medicaid Other | Admitting: *Deleted

## 2021-08-23 ENCOUNTER — Other Ambulatory Visit: Payer: Self-pay | Admitting: *Deleted

## 2021-08-23 ENCOUNTER — Ambulatory Visit: Payer: Medicaid Other | Attending: Obstetrics and Gynecology

## 2021-08-23 ENCOUNTER — Other Ambulatory Visit: Payer: Self-pay

## 2021-08-23 VITALS — BP 121/60 | HR 86

## 2021-08-23 DIAGNOSIS — O99212 Obesity complicating pregnancy, second trimester: Secondary | ICD-10-CM | POA: Insufficient documentation

## 2021-08-23 DIAGNOSIS — Z3A23 23 weeks gestation of pregnancy: Secondary | ICD-10-CM

## 2021-08-23 DIAGNOSIS — O0990 Supervision of high risk pregnancy, unspecified, unspecified trimester: Secondary | ICD-10-CM | POA: Diagnosis present

## 2021-08-23 DIAGNOSIS — O09892 Supervision of other high risk pregnancies, second trimester: Secondary | ICD-10-CM

## 2021-08-23 DIAGNOSIS — O34219 Maternal care for unspecified type scar from previous cesarean delivery: Secondary | ICD-10-CM | POA: Insufficient documentation

## 2021-08-23 DIAGNOSIS — E669 Obesity, unspecified: Secondary | ICD-10-CM | POA: Diagnosis not present

## 2021-08-23 DIAGNOSIS — O283 Abnormal ultrasonic finding on antenatal screening of mother: Secondary | ICD-10-CM

## 2021-08-23 DIAGNOSIS — O09522 Supervision of elderly multigravida, second trimester: Secondary | ICD-10-CM | POA: Insufficient documentation

## 2021-08-23 DIAGNOSIS — Z8632 Personal history of gestational diabetes: Secondary | ICD-10-CM | POA: Diagnosis present

## 2021-08-23 DIAGNOSIS — O09292 Supervision of pregnancy with other poor reproductive or obstetric history, second trimester: Secondary | ICD-10-CM

## 2021-09-04 ENCOUNTER — Ambulatory Visit (INDEPENDENT_AMBULATORY_CARE_PROVIDER_SITE_OTHER): Payer: Medicaid Other | Admitting: Obstetrics and Gynecology

## 2021-09-04 ENCOUNTER — Encounter: Payer: Self-pay | Admitting: Obstetrics and Gynecology

## 2021-09-04 VITALS — BP 131/70 | HR 81 | Wt 206.8 lb

## 2021-09-04 DIAGNOSIS — O09899 Supervision of other high risk pregnancies, unspecified trimester: Secondary | ICD-10-CM

## 2021-09-04 DIAGNOSIS — O0990 Supervision of high risk pregnancy, unspecified, unspecified trimester: Secondary | ICD-10-CM

## 2021-09-04 DIAGNOSIS — O34219 Maternal care for unspecified type scar from previous cesarean delivery: Secondary | ICD-10-CM

## 2021-09-04 DIAGNOSIS — Z8632 Personal history of gestational diabetes: Secondary | ICD-10-CM

## 2021-09-04 DIAGNOSIS — Z141 Cystic fibrosis carrier: Secondary | ICD-10-CM

## 2021-09-04 DIAGNOSIS — Z3009 Encounter for other general counseling and advice on contraception: Secondary | ICD-10-CM

## 2021-09-04 DIAGNOSIS — O09299 Supervision of pregnancy with other poor reproductive or obstetric history, unspecified trimester: Secondary | ICD-10-CM

## 2021-09-04 NOTE — Progress Notes (Signed)
Pt reports fetal movement, denies pain.  

## 2021-09-04 NOTE — Patient Instructions (Signed)

## 2021-09-04 NOTE — Progress Notes (Signed)
Subjective:  ?Paula Livingston is a 36 y.o. [id number] at [redacted]w[redacted]d being seen today for ongoing prenatal care.  She is currently monitored for the following issues for this high-risk pregnancy and has Hx of pre-eclampsia in prior pregnancy, currently pregnant; VBAC (vaginal birth after Cesarean); Hx of gestational diabetes in prior pregnancy, currently pregnant; Hx of preterm delivery, currently pregnant; History of congenital abnormalities; Obesity; Cystic fibrosis carrier; Supervision of high risk pregnancy, antepartum; and Unwanted fertility on their problem list. ? ?Patient reports no complaints.  Contractions: Not present. Vag. Bleeding: None.  Movement: Present. Denies leaking of fluid.  ? ?The following portions of the patient's history were reviewed and updated as appropriate: allergies, current medications, past family history, past medical history, past social history, past surgical history and problem list. Problem list updated. ? ?Objective:  ? ?Vitals:  ? 09/04/21 0936  ?BP: 131/70  ?Pulse: 81  ?Weight: 206 lb 12.8 oz (93.8 kg)  ? ? ?Fetal Status: Fetal Heart Rate (bpm): 138   Movement: Present    ? ?General:  Alert, oriented and cooperative. Patient is in no acute distress.  ?Skin: Skin is warm and dry. No rash noted.   ?Cardiovascular: Normal heart rate noted  ?Respiratory: Normal respiratory effort, no problems with respiration noted  ?Abdomen: Soft, gravid, appropriate for gestational age. Pain/Pressure: Absent     ?Pelvic:  Cervical exam deferred        ?Extremities: Normal range of motion.  Edema: None  ?Mental Status: Normal mood and affect. Normal behavior. Normal judgment and thought content.  ? ?Urinalysis:     ? ?Assessment and Plan:  ?Pregnancy: [id number] at [redacted]w[redacted]d ? ?1. Supervision of high risk pregnancy, antepartum ?Glucola next visit ? ?2. VBAC (vaginal birth after Cesarean) ?Successful x 3. ?VBAC consent papers signed 09/04/21 ? ?3. Cystic fibrosis carrier ?Stable ? ?4. Hx of preterm delivery,  currently pregnant ?Stable ?Currently no treatment ? ?5. Hx of gestational diabetes in prior pregnancy, currently pregnant ?Glucola next visit ?Normal A1c ? ?6. Hx of pre-eclampsia in prior pregnancy, currently pregnant ?No S/Sx of PEC at present ?Continue with qd BASA ? ?7. Unwanted fertility ?BTL papers signed today ? ?Preterm labor symptoms and general obstetric precautions including but not limited to vaginal bleeding, contractions, leaking of fluid and fetal movement were reviewed in detail with the patient. ?Please refer to After Visit Summary for other counseling recommendations.  ?Return in about 3 weeks (around 09/25/2021) for OB visit, face to face, MD only, fasting appt for Glucola. ? ? ?Chancy Milroy, MD ?

## 2021-09-25 ENCOUNTER — Ambulatory Visit (INDEPENDENT_AMBULATORY_CARE_PROVIDER_SITE_OTHER): Payer: Medicaid Other | Admitting: Obstetrics and Gynecology

## 2021-09-25 ENCOUNTER — Other Ambulatory Visit: Payer: Medicaid Other

## 2021-09-25 ENCOUNTER — Encounter: Payer: Self-pay | Admitting: Obstetrics and Gynecology

## 2021-09-25 VITALS — BP 132/80 | HR 84 | Wt 209.6 lb

## 2021-09-25 DIAGNOSIS — O34219 Maternal care for unspecified type scar from previous cesarean delivery: Secondary | ICD-10-CM | POA: Insufficient documentation

## 2021-09-25 DIAGNOSIS — Z3009 Encounter for other general counseling and advice on contraception: Secondary | ICD-10-CM

## 2021-09-25 DIAGNOSIS — O09299 Supervision of pregnancy with other poor reproductive or obstetric history, unspecified trimester: Secondary | ICD-10-CM

## 2021-09-25 DIAGNOSIS — O09899 Supervision of other high risk pregnancies, unspecified trimester: Secondary | ICD-10-CM

## 2021-09-25 DIAGNOSIS — Z3A28 28 weeks gestation of pregnancy: Secondary | ICD-10-CM

## 2021-09-25 DIAGNOSIS — O0992 Supervision of high risk pregnancy, unspecified, second trimester: Secondary | ICD-10-CM

## 2021-09-25 DIAGNOSIS — O0990 Supervision of high risk pregnancy, unspecified, unspecified trimester: Secondary | ICD-10-CM

## 2021-09-25 DIAGNOSIS — O09292 Supervision of pregnancy with other poor reproductive or obstetric history, second trimester: Secondary | ICD-10-CM

## 2021-09-25 DIAGNOSIS — O09892 Supervision of other high risk pregnancies, second trimester: Secondary | ICD-10-CM

## 2021-09-25 MED ORDER — PANTOPRAZOLE SODIUM 40 MG PO TBEC: 40.0000 mg | DELAYED_RELEASE_TABLET | Freq: Every day | ORAL | 2 refills | Status: AC

## 2021-09-25 NOTE — Progress Notes (Signed)
? ?  PRENATAL VISIT NOTE ? ?Subjective:  ?Paula Livingston is a 36 y.o. [id number] at [redacted]w[redacted]d being seen today for ongoing prenatal care.  She is currently monitored for the following issues for this high-risk pregnancy and has Hx of pre-eclampsia in prior pregnancy, currently pregnant; VBAC (vaginal birth after Cesarean); Hx of gestational diabetes in prior pregnancy, currently pregnant; Hx of preterm delivery, currently pregnant; History of congenital abnormalities; Obesity; Cystic fibrosis carrier; Supervision of high risk pregnancy, antepartum; Unwanted fertility; and Previous cesarean delivery affecting pregnancy on their problem list. ? ?Patient reports no complaints.  Contractions: Not present. Vag. Bleeding: None.  Movement: Present. Denies leaking of fluid.  ? ?The following portions of the patient's history were reviewed and updated as appropriate: allergies, current medications, past family history, past medical history, past social history, past surgical history and problem list.  ? ?Objective:  ? ?Vitals:  ? 09/25/21 0925  ?BP: 132/80  ?Pulse: 84  ?Weight: 209 lb 9.6 oz (95.1 kg)  ? ? ?Fetal Status: Fetal Heart Rate (bpm): 135 Fundal Height: 28 cm Movement: Present    ? ?General:  Alert, oriented and cooperative. Patient is in no acute distress.  ?Skin: Skin is warm and dry. No rash noted.   ?Cardiovascular: Normal heart rate noted  ?Respiratory: Normal respiratory effort, no problems with respiration noted  ?Abdomen: Soft, gravid, appropriate for gestational age.  Pain/Pressure: Present     ?Pelvic: Cervical exam deferred        ?Extremities: Normal range of motion.  Edema: None  ?Mental Status: Normal mood and affect. Normal behavior. Normal judgment and thought content.  ? ?Assessment and Plan:  ?Pregnancy: [id number] at 110w3d ?1. [redacted] weeks gestation of pregnancy ?Patient is doing well without complaints ?Third trimester labs with glucola today ?- Glucose Tolerance, 2 Hours w/1 Hour ?- CBC ?- RPR ?- HIV Antibody  (routine testing w rflx) ? ?2. Supervision of high risk pregnancy, antepartum ? ? ?3. Previous cesarean delivery affecting pregnancy ?Desires another TOLAC ?Consent previously signed ? ?4. Unwanted fertility ?BTL form signed previously ? ?5. Hx of pre-eclampsia in prior pregnancy, currently pregnant ?Normotensive and asymptomatic ?Continue ASA ? ?6. Hx of preterm delivery, currently pregnant ? ? ?Preterm labor symptoms and general obstetric precautions including but not limited to vaginal bleeding, contractions, leaking of fluid and fetal movement were reviewed in detail with the patient. ?Please refer to After Visit Summary for other counseling recommendations.  ? ?Return in about 2 weeks (around 10/09/2021) for in person, ROB, High risk. ? ?Future Appointments  ?Date Time Provider [place]  ?09/25/2021 10:35 AM Larkyn Greenberger, Vickii Chafe, MD [facility] None  ?10/25/2021  9:30 AM [facility] NURSE [facility]  ?10/25/2021  9:45 AM [facility] US5 [facility]  ? ? ?Mora Bellman, MD ? ?

## 2021-09-25 NOTE — Progress Notes (Signed)
ROB 28.[redacted] wks GA ?GTT, CBC, HIV, RPR ?TDAP offered and undecided ?Reports taking a lot of Tums for heartburn, no relief ?Depression and anxiety screen negative ?

## 2021-09-26 LAB — CBC
Hematocrit: 36.5 % (ref 34.0–46.6)
Hemoglobin: 12 g/dL (ref 11.1–15.9)
MCH: 28.2 pg (ref 26.6–33.0)
MCHC: 32.9 g/dL (ref 31.5–35.7)
MCV: 86 fL (ref 79–97)
Platelets: 211 10*3/uL (ref 150–450)
RBC: 4.26 x10E6/uL (ref 3.77–5.28)
RDW: 13.8 % (ref 11.7–15.4)
WBC: 5.4 10*3/uL (ref 3.4–10.8)

## 2021-09-26 LAB — GLUCOSE TOLERANCE, 2 HOURS W/ 1HR
Glucose, 1 hour: 116 mg/dL (ref 70–179)
Glucose, 2 hour: 111 mg/dL (ref 70–152)
Glucose, Fasting: 77 mg/dL (ref 70–91)

## 2021-09-26 LAB — HIV ANTIBODY (ROUTINE TESTING W REFLEX): HIV Screen 4th Generation wRfx: NONREACTIVE

## 2021-09-26 LAB — RPR: RPR Ser Ql: NONREACTIVE

## 2021-10-09 ENCOUNTER — Ambulatory Visit (INDEPENDENT_AMBULATORY_CARE_PROVIDER_SITE_OTHER): Payer: Medicaid Other | Admitting: Advanced Practice Midwife

## 2021-10-09 VITALS — BP 136/89 | HR 92 | Wt 211.2 lb

## 2021-10-09 DIAGNOSIS — O09899 Supervision of other high risk pregnancies, unspecified trimester: Secondary | ICD-10-CM

## 2021-10-09 DIAGNOSIS — Z3A30 30 weeks gestation of pregnancy: Secondary | ICD-10-CM

## 2021-10-09 DIAGNOSIS — O34219 Maternal care for unspecified type scar from previous cesarean delivery: Secondary | ICD-10-CM

## 2021-10-09 DIAGNOSIS — O0990 Supervision of high risk pregnancy, unspecified, unspecified trimester: Secondary | ICD-10-CM

## 2021-10-09 NOTE — Progress Notes (Signed)
Presents for ROB. BP 136/89 today. Denies headaches, vision changes. No complaints today. ?

## 2021-10-09 NOTE — Progress Notes (Signed)
? ?  PRENATAL VISIT NOTE ? ?Subjective:  ?Paula Livingston is a 36 y.o. [id number] at [redacted]w[redacted]d being seen today for ongoing prenatal care.  She is currently monitored for the following issues for this high-risk pregnancy and has Hx of pre-eclampsia in prior pregnancy, currently pregnant; VBAC (vaginal birth after Cesarean); Hx of gestational diabetes in prior pregnancy, currently pregnant; Hx of preterm delivery, currently pregnant; History of congenital abnormalities; Obesity; Cystic fibrosis carrier; Supervision of high risk pregnancy, antepartum; Unwanted fertility; and Previous cesarean delivery affecting pregnancy on their problem list. ? ?Patient reports no complaints.  Contractions: Not present. Vag. Bleeding: None.  Movement: Present. Denies leaking of fluid.  ? ?The following portions of the patient's history were reviewed and updated as appropriate: allergies, current medications, past family history, past medical history, past social history, past surgical history and problem list.  ? ?Objective:  ? ?Vitals:  ? 10/09/21 1437  ?BP: 136/89  ?Pulse: 92  ?Weight: 211 lb 3.2 oz (95.8 kg)  ? ? ?Fetal Status: Fetal Heart Rate (bpm): 138 Fundal Height: 30 cm Movement: Present    ? ?General:  Alert, oriented and cooperative. Patient is in no acute distress.  ?Skin: Skin is warm and dry. No rash noted.   ?Cardiovascular: Normal heart rate noted  ?Respiratory: Normal respiratory effort, no problems with respiration noted  ?Abdomen: Soft, gravid, appropriate for gestational age.  Pain/Pressure: Absent     ?Pelvic: Cervical exam deferred        ?Extremities: Normal range of motion.  Edema: None  ?Mental Status: Normal mood and affect. Normal behavior. Normal judgment and thought content.  ? ?Assessment and Plan:  ?Pregnancy: [id number] at [redacted]w[redacted]d ?1. Supervision of high risk pregnancy, antepartum ?--Anticipatory guidance about next visits/weeks of pregnancy given.  ? ?2. Hx of preterm delivery, currently pregnant ? ? ?3. Previous  cesarean delivery affecting pregnancy ?--VBAC x 3, desires VBAC, consent signed ? ?4. [redacted] weeks gestation of pregnancy ? ? ?Preterm labor symptoms and general obstetric precautions including but not limited to vaginal bleeding, contractions, leaking of fluid and fetal movement were reviewed in detail with the patient. ?Please refer to After Visit Summary for other counseling recommendations.  ? ?Return in about 2 weeks (around 10/23/2021) for HROB, Any provider. ? ?Future Appointments  ?Date Time Provider [place]  ?10/24/2021  3:30 PM Griffin Basil, MD [facility] None  ?10/25/2021  9:30 AM [facility] NURSE [facility]  ?10/25/2021  9:45 AM [facility] US5 [facility]  ? ? ?Fatima Blank, CNM  ?

## 2021-10-24 ENCOUNTER — Encounter: Payer: Medicaid Other | Admitting: Obstetrics and Gynecology

## 2021-10-25 ENCOUNTER — Other Ambulatory Visit: Payer: Self-pay | Admitting: *Deleted

## 2021-10-25 ENCOUNTER — Encounter: Payer: Self-pay | Admitting: *Deleted

## 2021-10-25 ENCOUNTER — Ambulatory Visit: Payer: Medicaid Other | Admitting: *Deleted

## 2021-10-25 ENCOUNTER — Ambulatory Visit: Payer: Medicaid Other | Attending: Obstetrics and Gynecology

## 2021-10-25 VITALS — BP 137/84 | HR 69

## 2021-10-25 DIAGNOSIS — O358XX0 Maternal care for other (suspected) fetal abnormality and damage, not applicable or unspecified: Secondary | ICD-10-CM

## 2021-10-25 DIAGNOSIS — E669 Obesity, unspecified: Secondary | ICD-10-CM

## 2021-10-25 DIAGNOSIS — O34219 Maternal care for unspecified type scar from previous cesarean delivery: Secondary | ICD-10-CM | POA: Insufficient documentation

## 2021-10-25 DIAGNOSIS — O09523 Supervision of elderly multigravida, third trimester: Secondary | ICD-10-CM

## 2021-10-25 DIAGNOSIS — Z8632 Personal history of gestational diabetes: Secondary | ICD-10-CM | POA: Diagnosis present

## 2021-10-25 DIAGNOSIS — O0990 Supervision of high risk pregnancy, unspecified, unspecified trimester: Secondary | ICD-10-CM | POA: Diagnosis present

## 2021-10-25 DIAGNOSIS — O09892 Supervision of other high risk pregnancies, second trimester: Secondary | ICD-10-CM | POA: Insufficient documentation

## 2021-10-25 DIAGNOSIS — Z141 Cystic fibrosis carrier: Secondary | ICD-10-CM

## 2021-10-25 DIAGNOSIS — Z3A32 32 weeks gestation of pregnancy: Secondary | ICD-10-CM

## 2021-10-25 DIAGNOSIS — O09293 Supervision of pregnancy with other poor reproductive or obstetric history, third trimester: Secondary | ICD-10-CM

## 2021-10-25 DIAGNOSIS — O09213 Supervision of pregnancy with history of pre-term labor, third trimester: Secondary | ICD-10-CM

## 2021-10-25 DIAGNOSIS — O285 Abnormal chromosomal and genetic finding on antenatal screening of mother: Secondary | ICD-10-CM | POA: Diagnosis not present

## 2021-10-25 DIAGNOSIS — O09522 Supervision of elderly multigravida, second trimester: Secondary | ICD-10-CM | POA: Diagnosis present

## 2021-10-25 DIAGNOSIS — O99213 Obesity complicating pregnancy, third trimester: Secondary | ICD-10-CM

## 2021-10-25 DIAGNOSIS — O09292 Supervision of pregnancy with other poor reproductive or obstetric history, second trimester: Secondary | ICD-10-CM | POA: Diagnosis present

## 2021-10-25 DIAGNOSIS — O283 Abnormal ultrasonic finding on antenatal screening of mother: Secondary | ICD-10-CM | POA: Insufficient documentation

## 2021-10-25 DIAGNOSIS — Z6838 Body mass index (BMI) 38.0-38.9, adult: Secondary | ICD-10-CM

## 2021-10-25 DIAGNOSIS — O09299 Supervision of pregnancy with other poor reproductive or obstetric history, unspecified trimester: Secondary | ICD-10-CM

## 2021-11-06 ENCOUNTER — Other Ambulatory Visit: Payer: Self-pay

## 2021-11-06 ENCOUNTER — Encounter: Payer: Self-pay | Admitting: Obstetrics and Gynecology

## 2021-11-06 ENCOUNTER — Inpatient Hospital Stay (HOSPITAL_COMMUNITY)
Admission: AD | Admit: 2021-11-06 | Discharge: 2021-11-09 | DRG: 807 | Disposition: A | Discharge status: Home / Self Care | Payer: Medicaid Other | Attending: Obstetrics and Gynecology | Admitting: Obstetrics and Gynecology

## 2021-11-06 ENCOUNTER — Ambulatory Visit (INDEPENDENT_AMBULATORY_CARE_PROVIDER_SITE_OTHER): Payer: Medicaid Other | Admitting: Obstetrics and Gynecology

## 2021-11-06 ENCOUNTER — Encounter (HOSPITAL_COMMUNITY): Payer: Self-pay | Admitting: Obstetrics & Gynecology

## 2021-11-06 ENCOUNTER — Inpatient Hospital Stay (HOSPITAL_COMMUNITY): Payer: Medicaid Other | Admitting: Anesthesiology

## 2021-11-06 VITALS — BP 163/104 | HR 69 | Wt 211.0 lb

## 2021-11-06 DIAGNOSIS — R030 Elevated blood-pressure reading, without diagnosis of hypertension: Secondary | ICD-10-CM | POA: Diagnosis present

## 2021-11-06 DIAGNOSIS — Z141 Cystic fibrosis carrier: Secondary | ICD-10-CM | POA: Diagnosis not present

## 2021-11-06 DIAGNOSIS — O1413 Severe pre-eclampsia, third trimester: Secondary | ICD-10-CM | POA: Diagnosis not present

## 2021-11-06 DIAGNOSIS — O34219 Maternal care for unspecified type scar from previous cesarean delivery: Secondary | ICD-10-CM

## 2021-11-06 DIAGNOSIS — Z98891 History of uterine scar from previous surgery: Secondary | ICD-10-CM

## 2021-11-06 DIAGNOSIS — O09299 Supervision of pregnancy with other poor reproductive or obstetric history, unspecified trimester: Secondary | ICD-10-CM

## 2021-11-06 DIAGNOSIS — O1410 Severe pre-eclampsia, unspecified trimester: Secondary | ICD-10-CM

## 2021-11-06 DIAGNOSIS — O34211 Maternal care for low transverse scar from previous cesarean delivery: Secondary | ICD-10-CM | POA: Diagnosis not present

## 2021-11-06 DIAGNOSIS — O0990 Supervision of high risk pregnancy, unspecified, unspecified trimester: Secondary | ICD-10-CM

## 2021-11-06 DIAGNOSIS — Z3009 Encounter for other general counseling and advice on contraception: Secondary | ICD-10-CM | POA: Diagnosis present

## 2021-11-06 DIAGNOSIS — O09899 Supervision of other high risk pregnancies, unspecified trimester: Secondary | ICD-10-CM

## 2021-11-06 DIAGNOSIS — O1414 Severe pre-eclampsia complicating childbirth: Principal | ICD-10-CM | POA: Diagnosis present

## 2021-11-06 DIAGNOSIS — Q899 Congenital malformation, unspecified: Secondary | ICD-10-CM

## 2021-11-06 DIAGNOSIS — Z3A34 34 weeks gestation of pregnancy: Secondary | ICD-10-CM

## 2021-11-06 LAB — CBC
HCT: 36.7 % (ref 36.0–46.0)
Hemoglobin: 13.5 g/dL (ref 12.0–15.0)
MCH: 30.3 pg (ref 26.0–34.0)
MCHC: 36.8 g/dL — ABNORMAL HIGH (ref 30.0–36.0)
MCV: 82.3 fL (ref 80.0–100.0)
Platelets: 212 10*3/uL (ref 150–400)
RBC: 4.46 MIL/uL (ref 3.87–5.11)
RDW: 14.6 % (ref 11.5–15.5)
WBC: 9.4 10*3/uL (ref 4.0–10.5)
nRBC: 0 % (ref 0.0–0.2)

## 2021-11-06 LAB — COMPREHENSIVE METABOLIC PANEL
ALT: 9 U/L (ref 0–44)
AST: 15 U/L (ref 15–41)
Albumin: 2.8 g/dL — ABNORMAL LOW (ref 3.5–5.0)
Alkaline Phosphatase: 167 U/L — ABNORMAL HIGH (ref 38–126)
Anion gap: 10 (ref 5–15)
BUN: 5 mg/dL — ABNORMAL LOW (ref 6–20)
CO2: 19 mmol/L — ABNORMAL LOW (ref 22–32)
Calcium: 8.5 mg/dL — ABNORMAL LOW (ref 8.9–10.3)
Chloride: 108 mmol/L (ref 98–111)
Creatinine, Ser: 0.61 mg/dL (ref 0.44–1.00)
GFR, Estimated: >60 mL/min (ref >60)
Glucose, Bld: 72 mg/dL (ref 70–99)
Potassium: 3.3 mmol/L — ABNORMAL LOW (ref 3.5–5.1)
Sodium: 137 mmol/L (ref 135–145)
Total Bilirubin: 0.4 mg/dL (ref 0.3–1.2)
Total Protein: 6.3 g/dL — ABNORMAL LOW (ref 6.5–8.1)

## 2021-11-06 LAB — CBC WITH DIFFERENTIAL/PLATELET
Abs Immature Granulocytes: 0.06 10*3/uL (ref 0.00–0.07)
Basophils Absolute: 0 10*3/uL (ref 0.0–0.1)
Basophils Relative: 0 %
Eosinophils Absolute: 0 10*3/uL (ref 0.0–0.5)
Eosinophils Relative: 1 %
HCT: 35.6 % — ABNORMAL LOW (ref 36.0–46.0)
Hemoglobin: 13 g/dL (ref 12.0–15.0)
Immature Granulocytes: 1 %
Lymphocytes Relative: 31 %
Lymphs Abs: 1.8 10*3/uL (ref 0.7–4.0)
MCH: 29.8 pg (ref 26.0–34.0)
MCHC: 36.5 g/dL — ABNORMAL HIGH (ref 30.0–36.0)
MCV: 81.7 fL (ref 80.0–100.0)
Monocytes Absolute: 0.6 10*3/uL (ref 0.1–1.0)
Monocytes Relative: 10 %
Neutro Abs: 3.3 10*3/uL (ref 1.7–7.7)
Neutrophils Relative %: 57 %
Platelets: 186 10*3/uL (ref 150–400)
RBC: 4.36 MIL/uL (ref 3.87–5.11)
RDW: 14.4 % (ref 11.5–15.5)
WBC: 5.9 10*3/uL (ref 4.0–10.5)
nRBC: 0 % (ref 0.0–0.2)

## 2021-11-06 LAB — PROTEIN / CREATININE RATIO, URINE
Creatinine, Urine: 163.59 mg/dL
Protein Creatinine Ratio: 0.3 mg/mg{Cre} — ABNORMAL HIGH (ref 0.00–0.15)
Total Protein, Urine: 49 mg/dL

## 2021-11-06 LAB — TYPE AND SCREEN
ABO/RH(D): A POS
Antibody Screen: NEGATIVE

## 2021-11-06 LAB — URINALYSIS, ROUTINE W REFLEX MICROSCOPIC
Bilirubin Urine: NEGATIVE
Glucose, UA: NEGATIVE mg/dL
Hgb urine dipstick: NEGATIVE
Ketones, ur: NEGATIVE mg/dL
Leukocytes,Ua: NEGATIVE
Nitrite: NEGATIVE
Protein, ur: 30 mg/dL — AB
Specific Gravity, Urine: 1.015 (ref 1.005–1.030)
pH: 6 (ref 5.0–8.0)

## 2021-11-06 LAB — GROUP B STREP BY PCR: Group B strep by PCR: NEGATIVE

## 2021-11-06 MED ORDER — MEASLES, MUMPS & RUBELLA VAC IJ SOLR: 0.5000 mL | Freq: Once | INTRAMUSCULAR | Status: DC

## 2021-11-06 MED ORDER — EPHEDRINE 5 MG/ML INJ: 10.0000 mg | INTRAVENOUS | Status: DC | PRN

## 2021-11-06 MED ORDER — OXYCODONE-ACETAMINOPHEN 5-325 MG PO TABS: 2.0000 | ORAL_TABLET | ORAL | Status: DC | PRN

## 2021-11-06 MED ORDER — PHENYLEPHRINE 80 MCG/ML (10ML) SYRINGE FOR IV PUSH (FOR BLOOD PRESSURE SUPPORT): 80.0000 ug | PREFILLED_SYRINGE | INTRAVENOUS | Status: DC | PRN

## 2021-11-06 MED ORDER — LACTATED RINGERS IV SOLN
500.0000 mL | Freq: Once | INTRAVENOUS | Status: AC
  Administered 2021-11-06: 500 mL via INTRAVENOUS

## 2021-11-06 MED ORDER — OXYTOCIN BOLUS FROM INFUSION
333.0000 mL | Freq: Once | INTRAVENOUS | Status: AC
  Administered 2021-11-06: 333 mL via INTRAVENOUS

## 2021-11-06 MED ORDER — ONDANSETRON HCL 4 MG/2ML IJ SOLN: 4.0000 mg | Freq: Four times a day (QID) | INTRAMUSCULAR | Status: DC | PRN

## 2021-11-06 MED ORDER — SIMETHICONE 80 MG PO CHEW: 80.0000 mg | CHEWABLE_TABLET | ORAL | Status: DC | PRN

## 2021-11-06 MED ORDER — LACTATED RINGERS IV SOLN: 500.0000 mL | INTRAVENOUS | Status: DC | PRN

## 2021-11-06 MED ORDER — LABETALOL HCL 200 MG PO TABS: 200.0000 mg | ORAL_TABLET | Freq: Two times a day (BID) | ORAL | Status: DC

## 2021-11-06 MED ORDER — MAGNESIUM SULFATE 40 GM/1000ML IV SOLN
2.0000 g/h | INTRAVENOUS | Status: AC
  Administered 2021-11-07: 2 g/h via INTRAVENOUS
  Filled 2021-11-06: qty 1000

## 2021-11-06 MED ORDER — PRENATAL MULTIVITAMIN CH
1.0000 | ORAL_TABLET | Freq: Every day | ORAL | Status: DC
  Administered 2021-11-07 – 2021-11-09 (×3): 1 via ORAL
  Filled 2021-11-06 (×3): qty 1

## 2021-11-06 MED ORDER — OXYTOCIN-SODIUM CHLORIDE 30-0.9 UT/500ML-% IV SOLN: 2.5000 [IU]/h | INTRAVENOUS | Status: AC

## 2021-11-06 MED ORDER — SODIUM CHLORIDE 0.9 % IV SOLN
5.0000 10*6.[IU] | Freq: Once | INTRAVENOUS | Status: AC
  Administered 2021-11-06: 5 10*6.[IU] via INTRAVENOUS
  Filled 2021-11-06: qty 5

## 2021-11-06 MED ORDER — DIPHENHYDRAMINE HCL 50 MG/ML IJ SOLN: 12.5000 mg | INTRAMUSCULAR | Status: DC | PRN

## 2021-11-06 MED ORDER — LIDOCAINE HCL (PF) 1 % IJ SOLN
INTRAMUSCULAR | Status: DC | PRN
  Administered 2021-11-06: 5 mL via EPIDURAL
  Administered 2021-11-06: 4 mL via EPIDURAL

## 2021-11-06 MED ORDER — LACTATED RINGERS AMNIOINFUSION
INTRAVENOUS | Status: DC
  Filled 2021-11-06 (×3): qty 1000

## 2021-11-06 MED ORDER — GABAPENTIN 100 MG PO CAPS
200.0000 mg | ORAL_CAPSULE | Freq: Every day | ORAL | Status: DC
Start: 2021-11-06 — End: 2021-11-09
  Administered 2021-11-06 – 2021-11-08 (×3): 200 mg via ORAL
  Filled 2021-11-06 (×3): qty 2

## 2021-11-06 MED ORDER — MENTHOL 3 MG MT LOZG: 1.0000 | LOZENGE | OROMUCOSAL | Status: DC | PRN

## 2021-11-06 MED ORDER — SIMETHICONE 80 MG PO CHEW
80.0000 mg | CHEWABLE_TABLET | Freq: Three times a day (TID) | ORAL | Status: DC
  Administered 2021-11-07 (×3): 80 mg via ORAL
  Filled 2021-11-06 (×4): qty 1

## 2021-11-06 MED ORDER — ENOXAPARIN SODIUM 40 MG/0.4ML IJ SOSY: 40.0000 mg | PREFILLED_SYRINGE | INTRAMUSCULAR | Status: DC

## 2021-11-06 MED ORDER — MAGNESIUM SULFATE BOLUS VIA INFUSION
4.0000 g | Freq: Once | INTRAVENOUS | Status: AC
  Filled 2021-11-06: qty 1000

## 2021-11-06 MED ORDER — WITCH HAZEL-GLYCERIN EX PADS: 1.0000 "application " | MEDICATED_PAD | CUTANEOUS | Status: DC | PRN

## 2021-11-06 MED ORDER — TETANUS-DIPHTH-ACELL PERTUSSIS 5-2.5-18.5 LF-MCG/0.5 IM SUSY: 0.5000 mL | PREFILLED_SYRINGE | Freq: Once | INTRAMUSCULAR | Status: DC

## 2021-11-06 MED ORDER — SOD CITRATE-CITRIC ACID 500-334 MG/5ML PO SOLN: 30.0000 mL | ORAL | Status: DC | PRN

## 2021-11-06 MED ORDER — LABETALOL HCL 5 MG/ML IV SOLN: 40.0000 mg | INTRAVENOUS | Status: DC | PRN

## 2021-11-06 MED ORDER — MAGNESIUM HYDROXIDE 400 MG/5ML PO SUSP: 30.0000 mL | ORAL | Status: DC | PRN

## 2021-11-06 MED ORDER — COCONUT OIL OIL: 1.0000 "application " | TOPICAL_OIL | Status: DC | PRN

## 2021-11-06 MED ORDER — MEDROXYPROGESTERONE ACETATE 150 MG/ML IM SUSP: 150.0000 mg | INTRAMUSCULAR | Status: DC | PRN

## 2021-11-06 MED ORDER — TRANEXAMIC ACID-NACL 1000-0.7 MG/100ML-% IV SOLN
INTRAVENOUS | Status: AC
  Filled 2021-11-06: qty 100

## 2021-11-06 MED ORDER — LACTATED RINGERS IV SOLN: INTRAVENOUS | Status: DC

## 2021-11-06 MED ORDER — MAGNESIUM SULFATE 40 GM/1000ML IV SOLN: 2.0000 g/h | INTRAVENOUS | Status: DC

## 2021-11-06 MED ORDER — IBUPROFEN 600 MG PO TABS
600.0000 mg | ORAL_TABLET | Freq: Four times a day (QID) | ORAL | Status: DC
  Administered 2021-11-08 – 2021-11-09 (×5): 600 mg via ORAL
  Filled 2021-11-06 (×6): qty 1

## 2021-11-06 MED ORDER — ACETAMINOPHEN 325 MG PO TABS
650.0000 mg | ORAL_TABLET | ORAL | Status: DC | PRN
  Administered 2021-11-06: 650 mg via ORAL
  Filled 2021-11-06: qty 2

## 2021-11-06 MED ORDER — MAGNESIUM SULFATE 40 GM/1000ML IV SOLN
INTRAVENOUS | Status: AC
  Administered 2021-11-06: 4 g via INTRAVENOUS
  Filled 2021-11-06: qty 1000

## 2021-11-06 MED ORDER — KETOROLAC TROMETHAMINE 30 MG/ML IJ SOLN
30.0000 mg | Freq: Four times a day (QID) | INTRAMUSCULAR | Status: AC
  Administered 2021-11-06 – 2021-11-07 (×4): 30 mg via INTRAVENOUS
  Filled 2021-11-06 (×4): qty 1

## 2021-11-06 MED ORDER — TRANEXAMIC ACID-NACL 1000-0.7 MG/100ML-% IV SOLN
1000.0000 mg | INTRAVENOUS | Status: AC
  Administered 2021-11-06: 1000 mg via INTRAVENOUS

## 2021-11-06 MED ORDER — FENTANYL CITRATE (PF) 100 MCG/2ML IJ SOLN: 100.0000 ug | INTRAMUSCULAR | Status: DC | PRN

## 2021-11-06 MED ORDER — SENNOSIDES-DOCUSATE SODIUM 8.6-50 MG PO TABS
2.0000 | ORAL_TABLET | Freq: Every day | ORAL | Status: DC
  Administered 2021-11-07 – 2021-11-08 (×2): 2 via ORAL
  Filled 2021-11-06 (×3): qty 2

## 2021-11-06 MED ORDER — TERBUTALINE SULFATE 1 MG/ML IJ SOLN
0.2500 mg | Freq: Once | INTRAMUSCULAR | Status: AC | PRN
  Administered 2021-11-06: 0.25 mg via SUBCUTANEOUS
  Filled 2021-11-06: qty 1

## 2021-11-06 MED ORDER — OXYCODONE HCL 5 MG PO TABS: 5.0000 mg | ORAL_TABLET | ORAL | Status: DC | PRN

## 2021-11-06 MED ORDER — OXYTOCIN-SODIUM CHLORIDE 30-0.9 UT/500ML-% IV SOLN
1.0000 m[IU]/min | INTRAVENOUS | Status: DC
  Administered 2021-11-06: 2 m[IU]/min via INTRAVENOUS
  Filled 2021-11-06: qty 500

## 2021-11-06 MED ORDER — ACETAMINOPHEN 500 MG PO TABS
1000.0000 mg | ORAL_TABLET | Freq: Four times a day (QID) | ORAL | Status: DC
  Administered 2021-11-06 – 2021-11-09 (×9): 1000 mg via ORAL
  Filled 2021-11-06 (×10): qty 2

## 2021-11-06 MED ORDER — LIDOCAINE HCL (PF) 1 % IJ SOLN
30.0000 mL | INTRAMUSCULAR | Status: DC | PRN
Start: 2021-11-06 — End: 2021-11-06

## 2021-11-06 MED ORDER — OXYTOCIN-SODIUM CHLORIDE 30-0.9 UT/500ML-% IV SOLN
2.5000 [IU]/h | INTRAVENOUS | Status: DC
  Administered 2021-11-06: 2.5 [IU]/h via INTRAVENOUS

## 2021-11-06 MED ORDER — FUROSEMIDE 20 MG PO TABS
20.0000 mg | ORAL_TABLET | Freq: Every day | ORAL | Status: DC
Start: 2021-11-07 — End: 2021-11-09
  Administered 2021-11-07 – 2021-11-09 (×3): 20 mg via ORAL
  Filled 2021-11-06 (×3): qty 1

## 2021-11-06 MED ORDER — PENICILLIN G POT IN DEXTROSE 60000 UNIT/ML IV SOLN
3.0000 10*6.[IU] | INTRAVENOUS | Status: DC
  Administered 2021-11-06: 3 10*6.[IU] via INTRAVENOUS
  Filled 2021-11-06 (×3): qty 50

## 2021-11-06 MED ORDER — HYDRALAZINE HCL 20 MG/ML IJ SOLN
10.0000 mg | INTRAMUSCULAR | Status: DC | PRN
  Administered 2021-11-06 (×2): 10 mg via INTRAVENOUS
  Filled 2021-11-06: qty 1

## 2021-11-06 MED ORDER — HYDRALAZINE HCL 20 MG/ML IJ SOLN
5.0000 mg | INTRAMUSCULAR | Status: DC | PRN
  Administered 2021-11-06 (×3): 5 mg via INTRAVENOUS
  Filled 2021-11-06 (×3): qty 1

## 2021-11-06 MED ORDER — NIFEDIPINE ER OSMOTIC RELEASE 30 MG PO TB24
30.0000 mg | ORAL_TABLET | Freq: Every day | ORAL | Status: DC
  Administered 2021-11-07 – 2021-11-08 (×2): 30 mg via ORAL
  Filled 2021-11-06 (×2): qty 1

## 2021-11-06 MED ORDER — DIBUCAINE (PERIANAL) 1 % EX OINT: 1.0000 "application " | TOPICAL_OINTMENT | CUTANEOUS | Status: DC | PRN

## 2021-11-06 MED ORDER — FENTANYL-BUPIVACAINE-NACL 0.5-0.125-0.9 MG/250ML-% EP SOLN
12.0000 mL/h | EPIDURAL | Status: DC | PRN
  Administered 2021-11-06: 10.5 mL/h via EPIDURAL
  Filled 2021-11-06: qty 250

## 2021-11-06 MED ORDER — OXYCODONE-ACETAMINOPHEN 5-325 MG PO TABS: 1.0000 | ORAL_TABLET | ORAL | Status: DC | PRN

## 2021-11-06 MED ORDER — LABETALOL HCL 5 MG/ML IV SOLN
20.0000 mg | INTRAVENOUS | Status: DC | PRN
  Administered 2021-11-06 (×2): 20 mg via INTRAVENOUS
  Filled 2021-11-06 (×2): qty 4

## 2021-11-06 MED ORDER — DIPHENHYDRAMINE HCL 25 MG PO CAPS: 25.0000 mg | ORAL_CAPSULE | Freq: Four times a day (QID) | ORAL | Status: DC | PRN

## 2021-11-06 NOTE — Progress Notes (Signed)
Pt has elevated BP today.  Pt does have slight HA and increase in LE swelling.

## 2021-11-06 NOTE — Anesthesia Procedure Notes (Signed)
Epidural Patient location during procedure: OB Start time: 11/06/2021 7:37 PM End time: 11/06/2021 7:45 PM  Staffing Anesthesiologist: Josephine Igo, MD Performed: anesthesiologist   Preanesthetic Checklist Completed: patient identified, IV checked, site marked, risks and benefits discussed, surgical consent, monitors and equipment checked, pre-op evaluation and timeout performed  Epidural Patient position: sitting Prep: DuraPrep and site prepped and draped Patient monitoring: continuous pulse ox and blood pressure Approach: midline Location: L3-L4 Injection technique: LOR air  Needle:  Needle type: Tuohy  Needle gauge: 17 G Needle length: 9 cm and 9 Needle insertion depth: 7 cm Catheter type: closed end flexible Catheter size: 19 Gauge Catheter at skin depth: 12 cm Test dose: negative and Other  Assessment Events: blood not aspirated, injection not painful, no injection resistance, no paresthesia and negative IV test  Additional Notes Patient identified. Risks and benefits discussed including failed block, incomplete  Pain control, post dural puncture headache, nerve damage, paralysis, blood pressure Changes, nausea, vomiting, reactions to medications-both toxic and allergic and post Partum back pain. All questions were answered. Patient expressed understanding and wished to proceed. Sterile technique was used throughout procedure. Epidural site was Dressed with sterile barrier dressing. No paresthesias, signs of intravascular injection Or signs of intrathecal spread were encountered.  Patient was more comfortable after the epidural was dosed. Please see RN's note for documentation of vital signs and FHR which are stable. Reason for block:procedure for pain

## 2021-11-06 NOTE — MAU Note (Signed)
...  Paula Livingston is a 36 y.o. at [redacted]w[redacted]d here in MAU reporting: Sent here from the office from elevated blood pressures. Denies HA, visual disturbances, RUQ/epigastric. Endorses swelling of her hands and feet. Denies VB or LOF. +FM.   Pain score: Denies pain.   FHT: 135 initial external Lab orders placed from triage:  UA

## 2021-11-06 NOTE — Anesthesia Preprocedure Evaluation (Signed)
Anesthesia Evaluation  Patient identified by MRN, date of birth, ID band Patient awake    Reviewed: Allergy & Precautions, Patient's Chart, lab work & pertinent test results  Airway Mallampati: III       Dental no notable dental hx. (+) Teeth Intact   Pulmonary  CF carrier   Pulmonary exam normal breath sounds clear to auscultation       Cardiovascular hypertension, Pt. on medications Normal cardiovascular exam Rhythm:Regular Rate:Normal     Neuro/Psych negative neurological ROS  negative psych ROS   GI/Hepatic Neg liver ROS, GERD  ,  Endo/Other  Morbid obesity  Renal/GU negative Renal ROS  negative genitourinary   Musculoskeletal negative musculoskeletal ROS (+)   Abdominal (+) + obese,   Peds  Hematology negative hematology ROS (+)   Anesthesia Other Findings   Reproductive/Obstetrics (+) Pregnancy Hx/o previous C/Section with successful VBAC Pre Eclampsia\ Pre Term                             Anesthesia Physical Anesthesia Plan  ASA: 3  Anesthesia Plan: Epidural   Post-op Pain Management: Regional block* and Minimal or no pain anticipated   Induction:   PONV Risk Score and Plan:   Airway Management Planned: Natural Airway  Additional Equipment: None  Intra-op Plan:   Post-operative Plan:   Informed Consent: I have reviewed the patients History and Physical, chart, labs and discussed the procedure including the risks, benefits and alternatives for the proposed anesthesia with the patient or authorized representative who has indicated his/her understanding and acceptance.       Plan Discussed with: Anesthesiologist  Anesthesia Plan Comments:         Anesthesia Quick Evaluation

## 2021-11-06 NOTE — Progress Notes (Signed)
Assessed patient at bedside due to deep variable decelerations after AROM. Not improved with position changes. Pitocin discontinued. IUPC placed without difficulty and amnioinfusion started. Given persistent variable decelerations, terbutaline also given. FHT subsequently improved. Attending Dr. Vergie Living updated with plan of care and in agreement with management. Discussed with patient that if baby continues to have difficulty tolerating contractions despite interventions, may need to proceed with cesarean section if remote from delivery. Patient voiced understanding. All questions and concerns addressed.   Evalina Field, MD

## 2021-11-06 NOTE — Progress Notes (Signed)
Subjective:  Paula Livingston is a 36 y.o. [id number] at [redacted]w[redacted]d being seen today for ongoing prenatal care.  She is currently monitored for the following issues for this high-risk pregnancy and has Hx of pre-eclampsia in prior pregnancy, currently pregnant; VBAC (vaginal birth after Cesarean); Hx of gestational diabetes in prior pregnancy, currently pregnant; Hx of preterm delivery, currently pregnant; History of congenital abnormalities; Obesity; Cystic fibrosis carrier; Supervision of high risk pregnancy, antepartum; Unwanted fertility; and Previous cesarean delivery affecting pregnancy on their problem list.  Patient reports mild HA.  Contractions: Irregular. Vag. Bleeding: None.  Movement: Present. Denies leaking of fluid.   The following portions of the patient's history were reviewed and updated as appropriate: allergies, current medications, past family history, past medical history, past social history, past surgical history and problem list. Problem list updated.  Objective:   Vitals:   11/06/21 0920 11/06/21 0924  BP: (!) 175/112 (!) 163/104  Pulse: 76 69  Weight: 211 lb (95.7 kg)     Fetal Status: Fetal Heart Rate (bpm): 135   Movement: Present     General:  Alert, oriented and cooperative. Patient is in no acute distress.  Skin: Skin is warm and dry. No rash noted.   Cardiovascular: Normal heart rate noted  Respiratory: Normal respiratory effort, no problems with respiration noted  Abdomen: Soft, gravid, appropriate for gestational age. Pain/Pressure: Present     Pelvic:  Cervical exam deferred        Extremities: Normal range of motion.  Edema: Trace  Mental Status: Normal mood and affect. Normal behavior. Normal judgment and thought content.   Urinalysis:      Assessment and Plan:  Pregnancy: [id number] at [redacted]w[redacted]d  1. Supervision of high risk pregnancy, antepartum Elevated BP with mild HA To MAU for further evaluation MAU notified Reviewed with pt  2. Hx of pre-eclampsia in  prior pregnancy, currently pregnant See above  3. VBAC (vaginal birth after Cesarean) Consented  4. Unwanted fertility Consented  Preterm labor symptoms and general obstetric precautions including but not limited to vaginal bleeding, contractions, leaking of fluid and fetal movement were reviewed in detail with the patient. Please refer to After Visit Summary for other counseling recommendations.  No follow-ups on file.   Chancy Milroy, MD

## 2021-11-06 NOTE — H&P (Signed)
History     CSN: [id number]  Arrival date and time: 11/06/21 1033   None     No chief complaint on file.  Paula Livingston, a  36 y.o. [id number] at [redacted]w[redacted]d presents to MAU following elevated BP in the office today. Upon arrival BP was:  11/06/21 1104, BP:(!) 168/105,  11/06/21 1051, BP:(!) 157/103.  Patient states she had a mild headache in the office today, but since arriving she it has gone away. Patient states shes had headaches and "seeing spots" in the past that she "thought were attributed to heat", that are usually relieved with rest and increased fluid intake. She currently denies blurred vision, headache and right sided epigastric pain. She states shes been having some braxton hicks contractions but denies vaginal bleeding, leaking of fluid and abnormal discharge.         OB History     Gravida  6   Para  5   Term  2   Preterm  3   AB  0   Living  5      SAB  0   IAB  0   Ectopic  0   Multiple  0   Live Births  5        Obstetric Comments  2007-IOL FOR PREECLAMPSIA 2008 C/S FETAL DISTRESS         Past Medical History:  Diagnosis Date   Infection 2005   gonorrhea   Infection 2005   CHLAMYDIA   Pregnancy induced hypertension 2007   Preterm labor 2007    Past Surgical History:  Procedure Laterality Date   CESAREAN SECTION      Family History  Problem Relation Age of Onset   Alcohol abuse Mother    Drug abuse Mother    Aneurysm Father        BRAIN   Cancer Maternal Aunt 88       BREAST   Cancer Maternal Uncle        PROSTATE   Diabetes Maternal Uncle    Alcohol abuse Maternal Uncle    Drug abuse Maternal Uncle    Cancer Maternal Uncle        PROSTATE   Alcohol abuse Maternal Uncle    Drug abuse Maternal Uncle    Cancer Maternal Uncle        BRAIN   Alcohol abuse Maternal Uncle    Drug abuse Maternal Uncle    Alcohol abuse Sister    Alcohol abuse Brother    Asthma Son    Birth defects Son        EXTRA DIGITS   Seizures Son         FEBRILE   Other Neg Hx     Social History   Tobacco Use   Smoking status: Never   Smokeless tobacco: Never  Vaping Use   Vaping Use: Never used  Substance Use Topics   Alcohol use: No   Drug use: No    Allergies: No Known Allergies  Medications Prior to Admission  Medication Sig Dispense Refill Last Dose   acetaminophen (TYLENOL) 325 MG tablet Take 650 mg by mouth every 6 (six) hours as needed for mild pain or headache.      aspirin EC 81 MG tablet Take 1 tablet (81 mg total) by mouth daily. Take after 12 weeks for prevention of preeclampsia later in pregnancy 300 tablet 2    Blood Pressure Monitoring (BLOOD PRESSURE KIT) DEVI 1 kit by Does not apply route  once a week. Check Blood Pressure regularly and record readings into the [employer] App.  Large Cuff.  DX O90.0 1 each 0    calcium carbonate (TUMS - DOSED IN MG ELEMENTAL CALCIUM) 500 MG chewable tablet Chew 1 tablet by mouth daily.      pantoprazole (PROTONIX) 40 MG tablet Take 1 tablet (40 mg total) by mouth daily. 30 tablet 2    Prenatal Vit-Fe Fumarate-FA (PRENATAL MULTIVITAMIN) TABS tablet Take 1 tablet by mouth daily at 12 noon.       Review of Systems  Constitutional:  Negative for fatigue.  Cardiovascular:  Positive for leg swelling.  Gastrointestinal:  Positive for abdominal pain. Negative for diarrhea, nausea and vomiting.  Genitourinary:  Negative for pelvic pain, vaginal bleeding, vaginal discharge and vaginal pain.  Musculoskeletal:  Positive for back pain.  Physical Exam   Blood pressure (!) 157/103, pulse 73, temperature 98.8 F (37.1 C), temperature source Oral, resp. rate 17, height _0  (1.499 m), weight 96.3 kg, SpO2 99 %, unknown if currently breastfeeding.  Physical Exam Vitals and nursing note reviewed.  Constitutional:      General: She is not in acute distress.    Appearance: Normal appearance.  HENT:     Head: Normocephalic.  Cardiovascular:     Rate and Rhythm: Normal rate.   Pulmonary:     Effort: Pulmonary effort is normal. No respiratory distress.  Abdominal:     Comments: Pregnant   Genitourinary:    General: Normal vulva.  Musculoskeletal:     Cervical back: Normal range of motion.     Right lower leg: Edema present.     Left lower leg: Edema present.     Comments: Mild bilateral LE swelling. Non pitting   Skin:    General: Skin is warm and dry.  Neurological:     Mental Status: She is alert and oriented to person, place, and time.  Psychiatric:        Mood and Affect: Mood normal.   Dilation: 2 Effacement (%): 60 Station: -2 Presentation: Vertex Exam by:: S. Joli Koob,CNM   MAU Course  Procedures Orders Placed This Encounter  Procedures   Group B strep by PCR   Urinalysis, Routine w reflex microscopic Urine, Clean Catch   Protein / creatinine ratio, urine   Comprehensive metabolic panel   CBC with Differential/Platelet   Vital signs   Notify physician (specify) Confirmatory reading of BP> 160/110 15 minutes later   Fetal monitoring   Continuous tocometry   Strict intake and output   Measure blood pressure    Meds ordered this encounter  Medications   AND Linked Order Group    hydrALAZINE (APRESOLINE) injection 5 mg    hydrALAZINE (APRESOLINE) injection 10 mg    labetalol (NORMODYNE) injection 20 mg    labetalol (NORMODYNE) injection 40 mg    MDM CNM notified MAU atteneding of  patient status and BPs s/p hydralazine x2.  Dr. Nehemiah Settle agreeable to plan for admission for IOL.   Assessment and Plan  Plan for IOL for PreE.  Dr. Nehemiah Settle notified NICU of admission d/t Red Status. NICU agreeable to accept infant in necessary.  PEC labs and GBS status pending  Admit patient to L&D. Patient transferred in stable condition.   Jacquiline Doe, CNM  11/06/2021, 10:58 AM

## 2021-11-06 NOTE — Progress Notes (Signed)
Labor Progress Note Paula Livingston is a 36 y.o. [id number] at [redacted]w[redacted]d who presented for IOL due to severe pre-eclampsia.   S: Doing well. No concerns. Reports a mild headache, recently got Tylenol for this. Reports she is feeling her contractions.  O:  BP (!) 155/94   Pulse (!) 110   Temp 98 F (36.7 C) (Oral)   Resp 16   Ht 4\' 11"  (1.499 m)   Wt 96.3 kg   LMP  (LMP Unknown)   SpO2 99%   BMI 42.90 kg/m   EFM: Baseline 130 bpm, moderate variability, + accels, no decels   CVE: Dilation: 4 Effacement (%): 50 Cervical Position: Posterior Station: -3 Presentation: Vertex Exam by:: Hillary Schwegler MD  A&P: 36 y.o. [id number] [redacted]w[redacted]d   #Labor: Progressing well. SVE similar to prior. Discussed AROM and patient verbally consented. AROM performed with large amount of clear fluid. Fetal head well applied. Mom and baby tolerated this well. Will continue Pitocin at current rate (6 milli-Units/min) and monitor over the next 30 min after AROM. Will continue to adjust Pitocin as needed after that time. Plan to recheck in 4-5 hours, sooner as needed.   #Pain: PRN; planning for epidural when ready  #FWB: Cat 1  #GBS unknown; receiving PCN  #Pre-eclampsia with severe features: Multiple severe range pressures. Received treatment x 4 today. Will add Labetalol 200 mg BID to help with BP control. On Mag. Mild headache at this time. Will reassess after Tylenol. Plan to repeat labs at 11 PM. Will continue to monitor.   Genia Del, MD 6:04 PM

## 2021-11-06 NOTE — Discharge Summary (Signed)
Postpartum Discharge Summary     Patient Name: Paula Livingston DOB: [birth year] MRN: [id number]  Date of admission: 11/06/2021 Delivery date:11/06/2021  Delivering provider: Patriciaann Clan  Date of discharge: 11/09/2021  Admitting diagnosis: Severe preeclampsia [O14.10] Intrauterine pregnancy: [redacted]w[redacted]d    Secondary diagnosis:  Principal Problem:   Severe preeclampsia Active Problems:   Hx of pre-eclampsia in prior pregnancy, currently pregnant   History of VBAC   Hx of gestational diabetes in prior pregnancy, currently pregnant   Hx of preterm delivery, currently pregnant   History of congenital abnormalities   Cystic fibrosis carrier   Supervision of high risk pregnancy, antepartum   Unwanted fertility   Previous cesarean delivery affecting pregnancy  Additional problems: none    Discharge diagnosis: Preterm Pregnancy Delivered and Preeclampsia (severe)                                              Post partum procedures: none   Augmentation: AROM and Pitocin Complications: None  Hospital course: Induction of Labor With Vaginal Delivery   36y.o. yo [id number] admitted to the hospital 11/06/2021 for induction of labor.  Indication for induction: Preeclampsia.  Patient had an uncomplicated labor course as follows: Membrane Rupture Time/Date: 5:51 PM ,11/06/2021   Delivery Method:VBAC, Spontaneous  Episiotomy: None  Lacerations:  None  Details of delivery can be found in separate delivery note.  Patient had a postpartum course notable for Magnesium x 24 hours. Given lasix and Procardia for BP control. Had severe range BPs on PPD #2. Increased BP meds. Improved control on PPD #3 but not normal. DC'd with additional Vasotec and Babyscripts monitoring. Patient is discharged home 11/09/21.  Newborn Data: Birth date:11/06/2021  Birth time:8:19 PM  Gender:Female  Living status:Living  Apgars:9 ,9  Weight:1940 g   Magnesium Sulfate received: Yes: Seizure prophylaxis BMZ  received: No Rhophylac:No MMR:No T-DaP: Offered Flu: No Transfusion:No  Physical exam  Vitals:   11/08/21 2259 11/09/21 0432 11/09/21 0822 11/09/21 1137  BP: (!) 145/81 (!) 143/71 (!) 142/79 131/86  Pulse: 86 86 90 (!) 102  Resp: _0 Temp: 98.9 F (37.2 C) 99.2 F (37.3 C) 99.2 F (37.3 C) 98.9 F (37.2 C)  TempSrc: Oral Oral Axillary Axillary  SpO2: 99% 99% 97% 99%  Weight:      Height:       General: alert, cooperative, and no distress Lochia: appropriate Uterine Fundus: firm DVT Evaluation: No evidence of DVT seen on physical exam. Labs: Lab Results  Component Value Date   WBC 10.7 (H) 11/07/2021   HGB 12.0 11/07/2021   HCT 34.2 (L) 11/07/2021   MCV 83.6 11/07/2021   PLT 191 11/07/2021      Latest Ref Rng & Units 11/06/2021   11:54 PM  CMP  Glucose 70 - 99 mg/dL 155   BUN 6 - 20 mg/dL 7   Creatinine 0.44 - 1.00 mg/dL 0.65   Sodium 135 - 145 mmol/L 136   Potassium 3.5 - 5.1 mmol/L 3.4   Chloride 98 - 111 mmol/L 110   CO2 22 - 32 mmol/L 16   Calcium 8.9 - 10.3 mg/dL 7.2   Total Protein 6.5 - 8.1 g/dL 5.5   Total Bilirubin 0.3 - 1.2 mg/dL 0.4   Alkaline Phos 38 - 126 U/L 133   AST  15 - 41 U/L 18   ALT 0 - 44 U/L 10    Edinburgh Score:    09/10/2017    7:00 PM  Edinburgh Postnatal Depression Scale Screening Tool  I have been able to laugh and see the funny side of things. 0  I have looked forward with enjoyment to things. 0  I have blamed myself unnecessarily when things went wrong. 0  I have been anxious or worried for no good reason. 0  I have felt scared or panicky for no good reason. 0  Things have been getting on top of me. 1  I have been so unhappy that I have had difficulty sleeping. 0  I have felt sad or miserable. 0  I have been so unhappy that I have been crying. 0  The thought of harming myself has occurred to me. 0  Edinburgh Postnatal Depression Scale Total 1     After visit meds:  Allergies as of 11/09/2021   No Known  Allergies      Medication List     TAKE these medications    acetaminophen 325 MG tablet Commonly known as: TYLENOL Take 650 mg by mouth every 6 (six) hours as needed for mild pain or headache.   aspirin EC 81 MG tablet Take 1 tablet (81 mg total) by mouth daily. Take after 12 weeks for prevention of preeclampsia later in pregnancy   Blood Pressure Kit Devi 1 kit by Does not apply route once a week. Check Blood Pressure regularly and record readings into the Babyscripts App.  Large Cuff.  DX O90.0   calcium carbonate 500 MG chewable tablet Commonly known as: TUMS - dosed in mg elemental calcium Chew 1 tablet by mouth daily.   enalapril 10 MG tablet Commonly known as: Vasotec Take 1 tablet (10 mg total) by mouth daily.   furosemide 20 MG tablet Commonly known as: LASIX Take 1 tablet (20 mg total) by mouth daily.   ibuprofen 600 MG tablet Commonly known as: ADVIL Take 1 tablet (600 mg total) by mouth every 6 (six) hours.   NIFEdipine 60 MG 24 hr tablet Commonly known as: ADALAT CC Take 1 tablet (60 mg total) by mouth every 12 (twelve) hours.   pantoprazole 40 MG tablet Commonly known as: Protonix Take 1 tablet (40 mg total) by mouth daily.   prenatal multivitamin Tabs tablet Take 1 tablet by mouth daily at 12 noon.               Durable Medical Equipment  (From admission, onward)           Start     Ordered   11/07/21 1007  For home use only DME double electric breast pump  Once        11/07/21 1006             Discharge home in stable condition Infant Feeding: Breast Infant Disposition:NICU Discharge instruction: per After Visit Summary and Postpartum booklet. Activity: Advance as tolerated. Pelvic rest for 6 weeks.  Diet: routine diet Future Appointments: Future Appointments  Date Time Provider Reeves  11/13/2021 11:00 AM Evaro None  12/19/2021  8:35 AM Woodroe Mode, MD [facility] None   Follow up Visit:   Noel Follow up.   Specialty: Obstetrics and Gynecology Contact information: [address] [place] [phone]  Message sent to [facility] by Dr Higinio Plan Please schedule this patient for a In person postpartum visit in 6 weeks with the following provider: Any provider. Additional Postpartum F/U:BP check 1 week  High risk pregnancy complicated by: HTN Delivery mode:  VBAC, Spontaneous  Anticipated Birth Control:  Plans interval BTL    11/09/2021 Griffin Basil, MD

## 2021-11-06 NOTE — Patient Instructions (Signed)

## 2021-11-07 ENCOUNTER — Encounter (HOSPITAL_COMMUNITY): Payer: Self-pay | Admitting: Family Medicine

## 2021-11-07 LAB — COMPREHENSIVE METABOLIC PANEL
ALT: 10 U/L (ref 0–44)
AST: 18 U/L (ref 15–41)
Albumin: 2.5 g/dL — ABNORMAL LOW (ref 3.5–5.0)
Alkaline Phosphatase: 133 U/L — ABNORMAL HIGH (ref 38–126)
Anion gap: 10 (ref 5–15)
BUN: 7 mg/dL (ref 6–20)
CO2: 16 mmol/L — ABNORMAL LOW (ref 22–32)
Calcium: 7.2 mg/dL — ABNORMAL LOW (ref 8.9–10.3)
Chloride: 110 mmol/L (ref 98–111)
Creatinine, Ser: 0.65 mg/dL (ref 0.44–1.00)
GFR, Estimated: >60 mL/min (ref >60)
Glucose, Bld: 155 mg/dL — ABNORMAL HIGH (ref 70–99)
Potassium: 3.4 mmol/L — ABNORMAL LOW (ref 3.5–5.1)
Sodium: 136 mmol/L (ref 135–145)
Total Bilirubin: 0.4 mg/dL (ref 0.3–1.2)
Total Protein: 5.5 g/dL — ABNORMAL LOW (ref 6.5–8.1)

## 2021-11-07 LAB — CBC
HCT: 34.2 % — ABNORMAL LOW (ref 36.0–46.0)
HCT: 36.1 % (ref 36.0–46.0)
Hemoglobin: 12 g/dL (ref 12.0–15.0)
Hemoglobin: 13.2 g/dL (ref 12.0–15.0)
MCH: 29.3 pg (ref 26.0–34.0)
MCH: 30.1 pg (ref 26.0–34.0)
MCHC: 35.1 g/dL (ref 30.0–36.0)
MCHC: 36.6 g/dL — ABNORMAL HIGH (ref 30.0–36.0)
MCV: 82.4 fL (ref 80.0–100.0)
MCV: 83.6 fL (ref 80.0–100.0)
Platelets: 183 10*3/uL (ref 150–400)
Platelets: 191 10*3/uL (ref 150–400)
RBC: 4.09 MIL/uL (ref 3.87–5.11)
RBC: 4.38 MIL/uL (ref 3.87–5.11)
RDW: 14.4 % (ref 11.5–15.5)
RDW: 14.6 % (ref 11.5–15.5)
WBC: 10.7 10*3/uL — ABNORMAL HIGH (ref 4.0–10.5)
WBC: 12.5 10*3/uL — ABNORMAL HIGH (ref 4.0–10.5)
nRBC: 0 % (ref 0.0–0.2)
nRBC: 0 % (ref 0.0–0.2)

## 2021-11-07 LAB — RPR: RPR Ser Ql: NONREACTIVE

## 2021-11-07 LAB — MAGNESIUM: Magnesium: 5.3 mg/dL — ABNORMAL HIGH (ref 1.7–2.4)

## 2021-11-07 MED ORDER — ENOXAPARIN SODIUM 40 MG/0.4ML IJ SOSY: 40.0000 mg | PREFILLED_SYRINGE | INTRAMUSCULAR | Status: DC

## 2021-11-07 MED ORDER — SOD CITRATE-CITRIC ACID 500-334 MG/5ML PO SOLN
ORAL | Status: AC
  Filled 2021-11-07: qty 30

## 2021-11-07 NOTE — Progress Notes (Signed)
Patient screened out for psychosocial assessment since none of the following apply:  Psychosocial stressors documented in mother or baby's chart  Gestation less than 32 weeks  Code at delivery   Infant with anomalies Please contact the Clinical Social Worker if specific needs arise, by MOB's request, or if MOB scores greater than 9/yes to question 10 on Edinburgh Postpartum Depression Screen.  Larell Baney, LCSW Clinical Social Worker [facility] Cell#: [phone]     

## 2021-11-07 NOTE — Progress Notes (Signed)
POSTPARTUM PROGRESS NOTE  Post Partum Day  1  Subjective:  Paula Livingston is a 36 y.o. [id number] s/p SVD at [redacted]w[redacted]d.  She reports she is doing well. No acute events overnight. She denies any problems with ambulating, voiding or po intake. Denies nausea or vomiting.  Pain is well controlled.  Lochia is normal.  Pt did have preeclampsia with severe features and is currently on magnesium sulfate.    Objective: Blood pressure 124/70, pulse 98, temperature 98.4 F (36.9 C), temperature source Oral, resp. rate 19, height 4\' 11"  (1.499 m), weight 96.3 kg, SpO2 98 %, unknown if currently breastfeeding.  Physical Exam:  General: alert, cooperative and no distress Chest: no respiratory distress Heart:regular rate, distal pulses intact Abdomen: soft, nontender,  Uterine Fundus: firm, appropriately tender DVT Evaluation: No calf swelling or tenderness Extremities: 1+ edema Skin: warm, dry  Recent Labs    11/06/21 2354 11/07/21 0433  HGB 13.2 12.0  HCT 36.1 34.2*    Assessment/Plan: Paula Livingston is a 36 y.o. 31 s/p VBAC at [redacted]w[redacted]d   PPD#1 - Doing well  Routine postpartum care Continue 24 hour MgSO4 until 2030 Hold po procardia if BP remains less than 135/85 Contraception: interval BTL Dispo: Plan for discharge if BP remains normal consider d/c on 11/08/21.   LOS: 1 day   01/08/22, MD Faculty attending 11/07/2021, 9:29 AM

## 2021-11-07 NOTE — Anesthesia Postprocedure Evaluation (Signed)
Anesthesia Post Note  Patient: Paula Livingston  Procedure(s) Performed: AN AD HOC LABOR EPIDURAL     Patient location during evaluation: OB High Risk Anesthesia Type: Epidural Level of consciousness: awake, awake and alert and oriented Pain management: pain level controlled Vital Signs Assessment: post-procedure vital signs reviewed and stable Respiratory status: spontaneous breathing, nonlabored ventilation and respiratory function stable Cardiovascular status: stable Postop Assessment: patient able to bend at knees, no headache, no apparent nausea or vomiting, adequate PO intake and able to ambulate Anesthetic complications: no   No notable events documented.  Last Vitals:  Vitals:   11/07/21 0700 11/07/21 0738  BP:    Pulse:    Resp: 18 18  Temp:  36.9 C  SpO2:  98%    Last Pain:  Vitals:   11/07/21 0800  TempSrc:   PainSc: 0-No pain   Pain Goal:                Epidural/Spinal Function Cutaneous sensation: Normal sensation (11/07/21 0800), Patient able to flex knees: Yes (11/07/21 0800), Patient able to lift hips off bed: Yes (11/07/21 0800), Back pain beyond tenderness at insertion site: No (11/07/21 0800), Progressively worsening motor and/or sensory loss: No (11/07/21 0800), Bowel and/or bladder incontinence post epidural: No (11/07/21 0800)  Marybeth Dandy

## 2021-11-07 NOTE — Lactation Note (Signed)
This note was copied from a baby's chart.  NICU Lactation Consultation Note  Patient Name: Paula Livingston CHENI'D Date: 11/07/2021 Age:36 hours   Subjective Reason for consult: Initial assessment; NICU baby; Late-preterm 34-36.6wks  Lactation conducted initial consult with Paula Livingston, a P6. She has experience with breastfeeding and pumping. I helped her initiate pumping and reviewed basics about milk volume in the first days, hand expression, oral care, etc. I provided the NICU [place] guide and resource information.  Paula Livingston has a personal pump purchased via [employer]. She is interested in receiving one through her insurance, if eligible. I followed up with her RN, Crystal, to ask for assistance with placing an order.  I provided a hands-free pumping top and fitted it to her.   Objective Infant data: Mother's Current Feeding Choice: Breast Milk and Donor Milk  Infant feeding assessment Scale for Readiness: 3    Maternal data: [id number]  Vaginal, Spontaneous  Current breast feeding challenges:: NICU; mom on mag   Does the patient have breastfeeding experience prior to this delivery?: Yes How long did the patient breastfeed?: up to 8 months  Pumping frequency: rec q3 hours; pump as physically able Pumped volume: 0 mL Flange Size: 24   Pump: Personal (purchased a pump on [employer]; will also check insurance provider)  Assessment  Maternal: Milk volume: Normal   Intervention/Plan Interventions: Breast feeding basics reviewed; DEBP; Education  Tools: Pump; Flanges Pump Education: Setup, frequency, and cleaning  Plan: Consult Status: NICU follow-up  NICU Follow-up type: New admission follow up; Weekly NICU follow up    Walker Shadow 11/07/2021, 10:10 AM

## 2021-11-08 DIAGNOSIS — O1413 Severe pre-eclampsia, third trimester: Secondary | ICD-10-CM

## 2021-11-08 MED ORDER — NIFEDIPINE ER OSMOTIC RELEASE 30 MG PO TB24
30.0000 mg | ORAL_TABLET | Freq: Once | ORAL | Status: AC
Start: 2021-11-08 — End: 2021-11-08
  Administered 2021-11-08: 30 mg via ORAL
  Filled 2021-11-08: qty 1

## 2021-11-08 MED ORDER — NIFEDIPINE ER OSMOTIC RELEASE 60 MG PO TB24
60.0000 mg | ORAL_TABLET | Freq: Two times a day (BID) | ORAL | Status: DC
  Administered 2021-11-08 – 2021-11-09 (×2): 60 mg via ORAL
  Filled 2021-11-08 (×2): qty 1

## 2021-11-08 MED ORDER — NIFEDIPINE ER OSMOTIC RELEASE 60 MG PO TB24: 60.0000 mg | ORAL_TABLET | Freq: Every day | ORAL | Status: DC

## 2021-11-08 NOTE — Lactation Note (Signed)
This note was copied from a baby's chart.  NICU Lactation Consultation Note  Patient Name: Boy Breaunna Halder [id number] Date: 11/08/2021 Age:36 hours   Subjective Reason for consult: Follow-up assessment Mother pumped once last night but has mostly been too sleepy because of Mag. She intends on pumping more frequently today. We reviewed best practices and she is aware of LC support in NICU prn.   Objective Infant data: Mother's Current Feeding Choice: Breast Milk and Donor Milk  Infant feeding assessment Scale for Readiness: 3    Maternal data: [id number]  VBAC, Spontaneous Current breast feeding challenges:: NICU; mom on mag  Does the patient have breastfeeding experience prior to this delivery?: Yes How long did the patient breastfeed?: up to 8 months  Pumping frequency: rec q3 hours; pump as physically able Pumped volume: 0 mL Flange Size: 24  Risk factor for low milk supply:: delay in pumping  Pump: Personal (purchased a pump on [employer]; will also check insurance provider)  Assessment Maternal: Milk volume: Normal   Intervention/Plan Interventions: Education; Infant Driven Feeding Algorithm education  Tools: Pump; Flanges Pump Education: Setup, frequency, and cleaning  Plan: Consult Status: NICU follow-up  NICU Follow-up type: Verify absence of engorgement; Maternal D/C visit; Verify onset of copious milk  Mother to begin pumping q3 today  [name] 11/08/2021, 8:08 AM

## 2021-11-08 NOTE — Progress Notes (Signed)
POSTPARTUM PROGRESS NOTE  Post Partum Day 2  Subjective:  Paula Livingston is a 36 y.o. [id number] s/p SVD at [redacted]w[redacted]d.  She reports she is doing well. No acute events overnight. She denies any problems with ambulating, voiding or po intake. Denies nausea or vomiting.  Pain is well controlled.  Lochia is WNL.  Pt had severe range blood pressures upon her return from the NICU.  Objective: Blood pressure (!) 147/84, pulse 81, temperature 98.5 F (36.9 C), temperature source Oral, resp. rate 17, height 4\' 11"  (1.499 m), weight 96.3 kg, SpO2 99 %, unknown if currently breastfeeding.  Physical Exam:  General: alert, cooperative and no distress Chest: no respiratory distress Heart:regular rate, distal pulses intact Abdomen: soft, nontender,  Uterine Fundus: firm, appropriately tender DVT Evaluation: No calf swelling or tenderness Extremities: trace edema Skin: warm, dry  Recent Labs    11/06/21 2354 11/07/21 0433  HGB 13.2 12.0  HCT 36.1 34.2*    Assessment/Plan: Paula Livingston is a 36 y.o. 31 s/p VBAC at [redacted]w[redacted]d   PPD# 2 - Doing well  Routine postpartum care BP elevated, procardia changed to 60 mg po BID Contraception: interval BTL Dispo: Plan for discharge will discharge on 11/09/21 if blood pressure has normalized..   LOS: 2 days   01/09/22, MD Faculty attending 11/08/2021, 4:17 PM

## 2021-11-09 ENCOUNTER — Other Ambulatory Visit (HOSPITAL_COMMUNITY): Payer: Self-pay

## 2021-11-09 MED ORDER — ENALAPRIL MALEATE 2.5 MG PO TABS
10.0000 mg | ORAL_TABLET | Freq: Every day | ORAL | Status: DC
  Administered 2021-11-09: 10 mg via ORAL
  Filled 2021-11-09: qty 4

## 2021-11-09 MED ORDER — NIFEDIPINE ER 60 MG PO TB24
60.0000 mg | ORAL_TABLET | Freq: Two times a day (BID) | ORAL | 1 refills | Status: AC
  Filled 2021-11-09: qty 60, 30d supply, fill #0

## 2021-11-09 MED ORDER — IBUPROFEN 600 MG PO TABS
600.0000 mg | ORAL_TABLET | Freq: Four times a day (QID) | ORAL | 0 refills | Status: AC
  Filled 2021-11-09: qty 30, 8d supply, fill #0

## 2021-11-09 MED ORDER — FUROSEMIDE 20 MG PO TABS
20.0000 mg | ORAL_TABLET | Freq: Every day | ORAL | 0 refills | Status: AC
  Filled 2021-11-09: qty 5, 5d supply, fill #0

## 2021-11-09 MED ORDER — ENALAPRIL MALEATE 10 MG PO TABS
10.0000 mg | ORAL_TABLET | Freq: Every day | ORAL | 0 refills | Status: AC
  Filled 2021-11-09: qty 30, 30d supply, fill #0

## 2021-11-09 NOTE — Lactation Note (Signed)
This note was copied from a baby's chart.  NICU Lactation Consultation Note  Patient Name: Boy Tomisha Reppucci WGNFA'O Date: 11/09/2021 Age:36 hours   Subjective Reason for consult: Follow-up assessment Mother pumped through the night and has pumped already this morning. She denies difficulty or pain with pumping. Mother intends on breastfeeding when infant is ready.   Objective Infant data: Mother's Current Feeding Choice: Breast Milk  Infant feeding assessment Scale for Readiness: 1   Maternal data: [id number]  VBAC, Spontaneous Pumping frequency: q3h Pumped volume: 7 mL  Risk factor for low milk supply:: delay in pumping  Pump: Personal (purchased a pump on [employer]; will also check insurance provider)  Assessment Maternal: Milk volume: Normal   Intervention/Plan Interventions: Visual merchandiser education; Education  Pump Education: Setup, frequency, and cleaning; Milk Storage  Plan: Consult Status: NICU follow-up  NICU Follow-up type: Verify onset of copious milk; Weekly NICU follow up; Verify absence of engorgement; Assist with IDF-2 (Mother does not need to pre-pump before breastfeeding); Assist with IDF-1 (Mother to pre-pump before breastfeeding)  Continue to pump q3h and bring any EBM to NICU LC to f/u for bf assistance prn.  Elder Negus 11/09/2021, 10:04 AM

## 2021-11-10 ENCOUNTER — Ambulatory Visit: Payer: Self-pay

## 2021-11-10 NOTE — Lactation Note (Signed)
This note was copied from a baby's chart. Lactation Consultation Note  Patient Name: Paula Livingston [id number] Date: 11/10/2021 Reason for consult: Follow-up assessment;NICU baby;Late-preterm 34-36.6wks;Infant < 6lbs;Other (Comment) (AMA) Age:36 days  [facility] referral form was successfully faxed to the [facility] office @ [place] per mother's request. She'll contact the [facility] office on Monday to get added to the program and for breast pump issuance. Next LC to check receipt of breast pump.  Feeding Mother's Current Feeding Choice: Breast Milk and Donor Milk  Lactation Tools Discussed/Used Tools: Pump;Flanges Flange Size: 24 Breast pump type: Double-Electric Breast Pump Pump Education: Setup, frequency, and cleaning;Milk Storage Reason for Pumping: LPI in NICU Pumping frequency: 6 times/24 hours Pumped volume: 10 mL  Interventions Interventions: Breast feeding basics reviewed;DEBP;Education;Infant Driven Feeding Algorithm education  Discharge Pump: Personal (DEBP at home from [facility])  Consult Status Consult Status: NICU follow-up Date: 11/10/21 Follow-up type: In-patient   [name] 11/10/2021, 6:20 PM

## 2021-11-10 NOTE — Lactation Note (Signed)
This note was copied from a baby'Livingston chart.  NICU Lactation Consultation Note  Patient Name: Paula Livingston ZOXWR'U Date: 11/10/2021 Age:36 days  Subjective Reason for consult: Follow-up assessment; NICU baby; Late-preterm 34-36.6wks; Infant < 6lbs; Other (Comment) (AMA)  Visited with mom of 67 hours old LPI NICU female, she reports her milk is slowly coming in, but she hasn't reached the 20 ml landmark yet to switch the settings on her hospital grade pump. Ms. Exantus voiced that she was in Mag during her hospital stay and that might have played a role in having a slight delay in lactogenesis II. Breast are soft with no Livingston/Livingston of engorgement upon examination. Reviewed lactogenesis II, pumping settings, pumping schedule and options on how to get mom a hospital grade pump, she does have one at home but voiced she likes the hospital one better.  Objective Infant data: Mother'Livingston Current Feeding Choice: Breast Milk and Donor Milk Infant feeding assessment Scale for Readiness: 2  Maternal data: [id number]  VBAC, Spontaneous Pumping frequency: 6 times/24 hours Pumped volume: 10 mL Flange Size: 24 Pump: Personal (DEBP at home from [employer])  Assessment Infant: In NICU  Maternal: Milk volume: Low  Intervention/Plan Interventions: Breast feeding basics reviewed; DEBP; Education; Infant Driven Feeding Algorithm education Tools: Pump; Flanges Pump Education: Setup, frequency, and cleaning; Milk Storage  Plan of care: Encouraged mom to start pumping consistently every 3 hours, at least 8 pumping sessions/24 hours She'll start putting baby to a full breast on feeding cues, but once her milk comes in, she understands she'll need to pump prior feedings at the breast  No other support person at this point. All questions and concerns answered, family to contact [facility] services PRN.  Consult Status: NICU follow-up NICU Follow-up type: Verify onset of copious milk; Verify absence of engorgement; Weekly  NICU follow up; Assist with IDF-2 (Mother does not need to pre-pump before breastfeeding)   Paula Livingston Paula Livingston 11/10/2021, 4:20 PM

## 2021-11-11 ENCOUNTER — Ambulatory Visit: Payer: Self-pay

## 2021-11-11 NOTE — Lactation Note (Signed)
This note was copied from a baby's chart.  NICU Lactation Consultation Note  Patient Name: Boy Alexia Dinger HYIFO'Y Date: 11/11/2021 Age:36 years   Subjective Reason for consult: Follow-up assessment; RN request  Lactation followed up. Ms. Crammer has been practicing latching baby [place] Comment. We planned to work on a feeding, but baby was too sleepy. He just underwent replacement of his feeding tube after pulling it out.   Ms. Friberg states that her at-home pump is ineffective. She plans to call [facility] tomorrow. LC, Mili, placed [facility] referral on 6/10. This LC plans to investigate stork pump and [facility] loaner options for today.   I reviewed the maintain program on the DEBP.  Objective Infant data: Mother's Current Feeding Choice: Breast Milk  Infant feeding assessment Scale for Readiness: 2  Maternal data: [id number]  VBAC, Spontaneous Current breast feeding challenges:: NICU; LPI  Does the patient have breastfeeding experience prior to this delivery?: Yes  Pumping frequency: rec q3 hours Pumped volume: 20 mL (20+ mls) Flange Size: 24   WIC Program: Yes WIC Referral Sent?: Yes Pump:  (working on obtaining a more permanent solution to her pump)  Assessment  Maternal: Milk volume: Low   Intervention/Plan Interventions: Breast feeding basics reviewed; DEBP; Education  Tools: Pump Pump Education: Setup, frequency, and cleaning  Plan: Consult Status: NICU follow-up (needs stork or WIC pump)  NICU Follow-up type: Verify onset of copious milk; Verify absence of engorgement; New admission follow up    Walker Shadow 11/11/2021, 11:27 AM

## 2021-11-12 ENCOUNTER — Ambulatory Visit: Payer: Self-pay

## 2021-11-12 NOTE — Lactation Note (Signed)
This note was copied from a baby's chart. Lactation Consultation Note Mother received WIC pump today. She continues to pump frequently and with an increasing volume. Her last pumping volume was 23mLs. Mother and infant continue to practice bf'ing.   Patient Name: Paula Livingston [id number] Date: 11/12/2021   Age:36 days   [name] 11/12/2021, 5:57 PM

## 2021-11-14 ENCOUNTER — Telehealth (HOSPITAL_COMMUNITY): Payer: Self-pay | Admitting: *Deleted

## 2021-11-14 DIAGNOSIS — Z1331 Encounter for screening for depression: Secondary | ICD-10-CM

## 2021-11-14 NOTE — Telephone Encounter (Signed)
Placed order for [facility] given EPDS today was 11.

## 2021-11-14 NOTE — Telephone Encounter (Signed)
Hospital Discharge Follow-Up Call:  Patient reports that she is doing well and "can't complain".  EPDS today was 11 and she attributes this to having a baby in the NICU and having other children at home.  Achieving balance regarding where to spend her time and meet everyone's needs is challenging.  She reports being enrolled in the [employer] program, but has not been checking her blood pressure at home.  She denies any symptoms of high BP.  She also says she has not been taking her antihypertensives "like I should" because they make her drowsy and this interferes with her meeting her responsibilities.  Encouraged her to take her BPs and enter them into [employer].  Also encouraged her to talk to her provider about alternative medications that may have fewer side effects.  Baby is in NICU.

## 2021-11-20 ENCOUNTER — Encounter: Payer: Medicaid Other | Admitting: Obstetrics & Gynecology

## 2021-11-22 ENCOUNTER — Ambulatory Visit: Payer: Medicaid Other

## 2021-11-27 ENCOUNTER — Encounter: Payer: Medicaid Other | Admitting: Obstetrics and Gynecology

## 2021-12-05 ENCOUNTER — Encounter: Payer: Medicaid Other | Admitting: Obstetrics and Gynecology

## 2021-12-10 ENCOUNTER — Encounter (HOSPITAL_COMMUNITY): Payer: Self-pay | Admitting: Family Medicine

## 2021-12-10 NOTE — Progress Notes (Signed)
Called patient, received VM.  Left a detailed message on machine with instructions for DOS.  PCP - obtain name of MD on DOS Cardiologist - n/a  Chest x-ray - n/a EKG - n/a Stress Test - n/a ECHO - n/a Cardiac Cath - n/a  ICD Pacemaker/Loop - n/a  Sleep Study -  n/a CPAP - none  ERAS: Clear liquids til 10:45 AM DOS  STOP now taking any Aspirin (unless otherwise instructed by your surgeon), Aleve, Naproxen, Ibuprofen, Motrin, Advil, Goody's, BC's, all herbal medications, fish oil, and all vitamins.   Aspirin Instructions: Follow your surgeon's instructions on when to stop aspirin prior to surgery,  If no instructions were given by your surgeon then you will need to call the office for those instructions.

## 2021-12-11 ENCOUNTER — Ambulatory Visit (HOSPITAL_COMMUNITY): Payer: Medicaid Other

## 2021-12-11 ENCOUNTER — Other Ambulatory Visit: Payer: Self-pay

## 2021-12-11 ENCOUNTER — Encounter (HOSPITAL_COMMUNITY): Payer: Self-pay | Admitting: Family Medicine

## 2021-12-11 ENCOUNTER — Ambulatory Visit (HOSPITAL_BASED_OUTPATIENT_CLINIC_OR_DEPARTMENT_OTHER): Payer: Medicaid Other

## 2021-12-11 ENCOUNTER — Encounter (HOSPITAL_COMMUNITY)
Admission: RE | Disposition: A | Discharge status: Home / Self Care | Payer: Self-pay | Source: Home / Self Care | Attending: Family Medicine

## 2021-12-11 ENCOUNTER — Ambulatory Visit (HOSPITAL_COMMUNITY)
Admission: RE | Admit: 2021-12-11 | Discharge: 2021-12-11 | Disposition: A | Discharge status: Home / Self Care | Payer: Medicaid Other | Attending: Family Medicine | Admitting: Family Medicine

## 2021-12-11 DIAGNOSIS — Z302 Encounter for sterilization: Secondary | ICD-10-CM | POA: Diagnosis present

## 2021-12-11 DIAGNOSIS — I10 Essential (primary) hypertension: Secondary | ICD-10-CM | POA: Insufficient documentation

## 2021-12-11 DIAGNOSIS — Z3009 Encounter for other general counseling and advice on contraception: Secondary | ICD-10-CM

## 2021-12-11 DIAGNOSIS — Z98891 History of uterine scar from previous surgery: Secondary | ICD-10-CM | POA: Diagnosis not present

## 2021-12-11 HISTORY — PX: LAPAROSCOPIC BILATERAL SALPINGECTOMY: SHX5889

## 2021-12-11 LAB — BASIC METABOLIC PANEL
Anion gap: 8 (ref 5–15)
BUN: 11 mg/dL (ref 6–20)
CO2: 23 mmol/L (ref 22–32)
Calcium: 8.9 mg/dL (ref 8.9–10.3)
Chloride: 105 mmol/L (ref 98–111)
Creatinine, Ser: 0.6 mg/dL (ref 0.44–1.00)
GFR, Estimated: >60 mL/min (ref >60)
Glucose, Bld: 89 mg/dL (ref 70–99)
Potassium: 3.7 mmol/L (ref 3.5–5.1)
Sodium: 136 mmol/L (ref 135–145)

## 2021-12-11 LAB — CBC
HCT: 39.3 % (ref 36.0–46.0)
Hemoglobin: 14 g/dL (ref 12.0–15.0)
MCH: 29 pg (ref 26.0–34.0)
MCHC: 35.6 g/dL (ref 30.0–36.0)
MCV: 81.4 fL (ref 80.0–100.0)
Platelets: 247 10*3/uL (ref 150–400)
RBC: 4.83 MIL/uL (ref 3.87–5.11)
RDW: 13.6 % (ref 11.5–15.5)
WBC: 5.1 10*3/uL (ref 4.0–10.5)
nRBC: 0 % (ref 0.0–0.2)

## 2021-12-11 LAB — POCT PREGNANCY, URINE: Preg Test, Ur: NEGATIVE

## 2021-12-11 SURGERY — SALPINGECTOMY, BILATERAL, LAPAROSCOPIC
Anesthesia: General | Site: Uterus | Laterality: Bilateral

## 2021-12-11 MED ORDER — BUPIVACAINE HCL (PF) 0.25 % IJ SOLN
INTRAMUSCULAR | Status: AC
  Filled 2021-12-11: qty 10

## 2021-12-11 MED ORDER — ORAL CARE MOUTH RINSE: 15.0000 mL | Freq: Once | OROMUCOSAL | Status: AC

## 2021-12-11 MED ORDER — POVIDONE-IODINE 10 % EX SWAB
2.0000 | Freq: Once | CUTANEOUS | Status: AC
  Administered 2021-12-11: 2 via TOPICAL

## 2021-12-11 MED ORDER — PROPOFOL 500 MG/50ML IV EMUL
INTRAVENOUS | Status: DC | PRN
  Administered 2021-12-11: 50 ug/kg/min via INTRAVENOUS

## 2021-12-11 MED ORDER — ROCURONIUM BROMIDE 10 MG/ML (PF) SYRINGE
PREFILLED_SYRINGE | INTRAVENOUS | Status: AC
  Filled 2021-12-11: qty 10

## 2021-12-11 MED ORDER — SODIUM CHLORIDE 0.9 % IR SOLN
Status: DC | PRN
  Administered 2021-12-11: 350 mL

## 2021-12-11 MED ORDER — ONDANSETRON HCL 4 MG/2ML IJ SOLN
INTRAMUSCULAR | Status: DC | PRN
  Administered 2021-12-11: 4 mg via INTRAVENOUS

## 2021-12-11 MED ORDER — CHLORHEXIDINE GLUCONATE 0.12 % MT SOLN
15.0000 mL | Freq: Once | OROMUCOSAL | Status: AC
  Administered 2021-12-11: 15 mL via OROMUCOSAL
  Filled 2021-12-11: qty 15

## 2021-12-11 MED ORDER — OXYCODONE HCL 5 MG PO TABS: 5.0000 mg | ORAL_TABLET | Freq: Four times a day (QID) | ORAL | 0 refills | Status: AC | PRN

## 2021-12-11 MED ORDER — MIDAZOLAM HCL 2 MG/2ML IJ SOLN
INTRAMUSCULAR | Status: DC | PRN
  Administered 2021-12-11: 2 mg via INTRAVENOUS

## 2021-12-11 MED ORDER — GABAPENTIN 300 MG PO CAPS
300.0000 mg | ORAL_CAPSULE | ORAL | Status: AC
  Administered 2021-12-11: 300 mg via ORAL
  Filled 2021-12-11: qty 1

## 2021-12-11 MED ORDER — LIDOCAINE 2% (20 MG/ML) 5 ML SYRINGE
INTRAMUSCULAR | Status: DC | PRN
  Administered 2021-12-11: 100 mg via INTRAVENOUS

## 2021-12-11 MED ORDER — FENTANYL CITRATE (PF) 250 MCG/5ML IJ SOLN
INTRAMUSCULAR | Status: AC
  Filled 2021-12-11: qty 5

## 2021-12-11 MED ORDER — OXYCODONE HCL 5 MG PO TABS: 5.0000 mg | ORAL_TABLET | Freq: Once | ORAL | Status: DC | PRN

## 2021-12-11 MED ORDER — ONDANSETRON HCL 4 MG/2ML IJ SOLN
INTRAMUSCULAR | Status: AC
  Filled 2021-12-11: qty 2

## 2021-12-11 MED ORDER — FENTANYL CITRATE (PF) 100 MCG/2ML IJ SOLN: 25.0000 ug | INTRAMUSCULAR | Status: DC | PRN

## 2021-12-11 MED ORDER — BUPIVACAINE HCL (PF) 0.25 % IJ SOLN
INTRAMUSCULAR | Status: DC | PRN
  Administered 2021-12-11: 10 mL

## 2021-12-11 MED ORDER — SUGAMMADEX SODIUM 200 MG/2ML IV SOLN
INTRAVENOUS | Status: DC | PRN
  Administered 2021-12-11: 200 mg via INTRAVENOUS

## 2021-12-11 MED ORDER — LIDOCAINE 2% (20 MG/ML) 5 ML SYRINGE
INTRAMUSCULAR | Status: AC
Start: 2021-12-11 — End: ?
  Filled 2021-12-11: qty 5

## 2021-12-11 MED ORDER — OXYCODONE HCL 5 MG/5ML PO SOLN: 5.0000 mg | Freq: Once | ORAL | Status: DC | PRN

## 2021-12-11 MED ORDER — ROCURONIUM BROMIDE 10 MG/ML (PF) SYRINGE
PREFILLED_SYRINGE | INTRAVENOUS | Status: DC | PRN
  Administered 2021-12-11: 60 mg via INTRAVENOUS

## 2021-12-11 MED ORDER — CELECOXIB 200 MG PO CAPS
400.0000 mg | ORAL_CAPSULE | ORAL | Status: AC
  Administered 2021-12-11: 400 mg via ORAL
  Filled 2021-12-11: qty 2

## 2021-12-11 MED ORDER — BUPIVACAINE HCL (PF) 0.25 % IJ SOLN
INTRAMUSCULAR | Status: AC
Start: 2021-12-11 — End: ?
  Filled 2021-12-11: qty 30

## 2021-12-11 MED ORDER — ACETAMINOPHEN 500 MG PO TABS
1000.0000 mg | ORAL_TABLET | ORAL | Status: AC
  Administered 2021-12-11: 1000 mg via ORAL
  Filled 2021-12-11: qty 2

## 2021-12-11 MED ORDER — LACTATED RINGERS IV SOLN: INTRAVENOUS | Status: DC

## 2021-12-11 MED ORDER — MIDAZOLAM HCL 2 MG/2ML IJ SOLN
INTRAMUSCULAR | Status: AC
  Filled 2021-12-11: qty 2

## 2021-12-11 MED ORDER — FENTANYL CITRATE (PF) 250 MCG/5ML IJ SOLN
INTRAMUSCULAR | Status: DC | PRN
  Administered 2021-12-11 (×2): 100 ug via INTRAVENOUS
  Administered 2021-12-11: 50 ug via INTRAVENOUS

## 2021-12-11 MED ORDER — DEXAMETHASONE SODIUM PHOSPHATE 10 MG/ML IJ SOLN
INTRAMUSCULAR | Status: DC | PRN
  Administered 2021-12-11: 10 mg via INTRAVENOUS

## 2021-12-11 MED ORDER — ONDANSETRON HCL 4 MG/2ML IJ SOLN: 4.0000 mg | Freq: Once | INTRAMUSCULAR | Status: DC | PRN

## 2021-12-11 MED ORDER — SUGAMMADEX SODIUM 500 MG/5ML IV SOLN
INTRAVENOUS | Status: AC
  Filled 2021-12-11: qty 5

## 2021-12-11 MED ORDER — DEXAMETHASONE SODIUM PHOSPHATE 10 MG/ML IJ SOLN
INTRAMUSCULAR | Status: AC
  Filled 2021-12-11: qty 1

## 2021-12-11 MED ORDER — PROPOFOL 10 MG/ML IV BOLUS
INTRAVENOUS | Status: DC | PRN
  Administered 2021-12-11: 200 mg via INTRAVENOUS

## 2021-12-11 SURGICAL SUPPLY — 35 items
CNTNR URN SCR LID CUP LEK RST (MISCELLANEOUS) IMPLANT
CONT SPEC 4OZ STRL OR WHT (MISCELLANEOUS) ×1
DERMABOND ADVANCED (GAUZE/BANDAGES/DRESSINGS) ×1
DERMABOND ADVANCED .7 DNX12 (GAUZE/BANDAGES/DRESSINGS) ×1 IMPLANT
DRSG OPSITE POSTOP 3X4 (GAUZE/BANDAGES/DRESSINGS) ×1 IMPLANT
DURAPREP 26ML APPLICATOR (WOUND CARE) ×2 IMPLANT
GLOVE BIOGEL PI IND STRL 7.0 (GLOVE) ×4 IMPLANT
GLOVE BIOGEL PI INDICATOR 7.0 (GLOVE) ×2
GLOVE ECLIPSE 7.0 STRL STRAW (GLOVE) ×2 IMPLANT
GLOVE SURG ENC MOIS LTX SZ7 (GLOVE) ×2 IMPLANT
GOWN STRL REUS W/ TWL LRG LVL3 (GOWN DISPOSABLE) ×3 IMPLANT
GOWN STRL REUS W/TWL LRG LVL3 (GOWN DISPOSABLE) ×3
IRRIG SUCT STRYKERFLOW 2 WTIP (MISCELLANEOUS)
IRRIGATION SUCT STRKRFLW 2 WTP (MISCELLANEOUS) ×1 IMPLANT
KIT TURNOVER KIT B (KITS) ×2 IMPLANT
PACK LAPAROSCOPY BASIN (CUSTOM PROCEDURE TRAY) ×2 IMPLANT
PACK TRENDGUARD 450 HYBRID PRO (MISCELLANEOUS) IMPLANT
POUCH SPECIMEN RETRIEVAL 10MM (ENDOMECHANICALS) ×2 IMPLANT
PROTECTOR NERVE ULNAR (MISCELLANEOUS) ×2 IMPLANT
SET TUBE SMOKE EVAC HIGH FLOW (TUBING) ×2 IMPLANT
SHEARS HARMONIC ACE PLUS 36CM (ENDOMECHANICALS) IMPLANT
SLEEVE ENDOPATH XCEL 5M (ENDOMECHANICALS) ×2 IMPLANT
SLEEVE XCEL OPT CAN 5 100 (ENDOMECHANICALS) ×1 IMPLANT
SPONGE T-LAP 18X18 ~~LOC~~+RFID (SPONGE) ×1 IMPLANT
SUT VIC AB 3-0 PS2 18 (SUTURE) ×1
SUT VIC AB 3-0 PS2 18XBRD (SUTURE) ×1 IMPLANT
SUT VICRYL 0 UR6 27IN ABS (SUTURE) ×4 IMPLANT
SUT VICRYL 4-0 PS2 18IN ABS (SUTURE) ×2 IMPLANT
TOWEL GREEN STERILE FF (TOWEL DISPOSABLE) ×4 IMPLANT
TRAY FOLEY W/BAG SLVR 14FR (SET/KITS/TRAYS/PACK) ×2 IMPLANT
TRENDGUARD 450 HYBRID PRO PACK (MISCELLANEOUS)
TROCAR BALLN 12MMX100 BLUNT (TROCAR) ×3 IMPLANT
TROCAR XCEL NON-BLD 11X100MML (ENDOMECHANICALS) IMPLANT
TROCAR XCEL NON-BLD 5MMX100MML (ENDOMECHANICALS) ×3 IMPLANT
WARMER LAPAROSCOPE (MISCELLANEOUS) ×2 IMPLANT

## 2021-12-11 NOTE — Op Note (Signed)
PROCEDURE DATE: 12/11/2021  PREOPERATIVE DIAGNOSES: Undesired fertility  POSTOPERATIVE DIAGNOSES: The same   PROCEDURE: Laparoscopic bilateral salpingctomy   SURGEON: Dr. Reva Bores   ASSISTANT: None  ANESTHESIOLOGIST: Leonides Grills, MD MD - GETT  INDICATIONS: 36 y.o. [phone] who desired permanent sterilization.  FINDINGS: Normal appearing uterus, right tube and ovaries. Left tube is adherent to the anterior side wall in front of the round ligament, ? endometriosis   ESTIMATED BLOOD LOSS: 25 ml   SPECIMENS: Bilateral fallopian tubes to pathology  COMPLICATIONS: None immediately known   PROCEDURE IN DETAIL: The patient had sequential compression devices applied to her lower extremities while in the preoperative area. She was then taken to the operating room where general anesthesia was administered and was found to be adequate. She was placed in the dorsal lithotomy position, and was prepped and draped in a sterile manner. A Red rubber catheter was inserted into her bladder and drained a clear unknown amount of urine. A speculum was placed inside the vagina, and the cervix grasped with an single tooth tenaculum. A Hulka tenaculum was placed through the cervix for uterine manipulation. Attention was then turned to the patient's abdomen where a 5-mm skin incision was made in the umbilicus. Opti-view used for direct visualized entry. Intraperitoneal placement was confirmed and insufflation done. A survey of the patient's pelvis and abdomen revealed the findings above. Two left sided 5-mm lower quadrant ports were then placed under direct visualization. On the right side, the tube was separated from the mesosalpinx with the Harmonic device.  On the left side, the tube was separated from the mesosalpinx with the Harmonic device and freed from the adhesion on the anterior side wall.  The specimen was then removed from the abdomen through the 5-mm port, under direct visualization. The operative  site was surveyed, and found to be hemostatic. No intraoperative injury to other surrounding organs was noted. The abdomen was desufflated and all instruments were then removed from the patient's abdomen. All skin incisions were closed with 3-0 Vicryl subcuticular stitches/Dermabond.   Reva Bores MD 12/11/2021 2:19 PM

## 2021-12-11 NOTE — Transfer of Care (Signed)
Immediate Anesthesia Transfer of Care Note  Patient: Paula Livingston  Procedure(s) Performed: LAPAROSCOPIC BILATERAL SALPINGECTOMY (Bilateral: Uterus)  Patient Location: PACU  Anesthesia Type:General  Level of Consciousness: drowsy  Airway & Oxygen Therapy: Patient Spontanous Breathing  Post-op Assessment: Report given to RN, Post -op Vital signs reviewed and stable and Patient moving all extremities  Post vital signs: Reviewed and stable  Last Vitals:  Vitals Value Taken Time  BP 145/107 12/11/21 1428  Temp    Pulse 95 12/11/21 1429  Resp 15 12/11/21 1429  SpO2 94 % 12/11/21 1429  Vitals shown include unvalidated device data.  Last Pain:  Vitals:   12/11/21 1146  TempSrc:   PainSc: 0-No pain         Complications: No notable events documented.

## 2021-12-11 NOTE — H&P (Signed)
Paula Livingston is an 36 y.o. [phone] female.   Chief Complaint: undesired fertility HPI: Here for interval BTL. S/p VBAC ion 6/23. Does not desire more children.  Past Medical History:  Diagnosis Date   Infection 06/04/2003   gonorrhea   Infection 06/04/2003   CHLAMYDIA   Pregnancy induced hypertension 06/03/2005   Preterm labor 06/03/2005    Past Surgical History:  Procedure Laterality Date   CESAREAN SECTION      Family History  Problem Relation Age of Onset   Alcohol abuse Mother    Drug abuse Mother    Aneurysm Father        BRAIN   Cancer Maternal Aunt 33       BREAST   Cancer Maternal Uncle        PROSTATE   Diabetes Maternal Uncle    Alcohol abuse Maternal Uncle    Drug abuse Maternal Uncle    Cancer Maternal Uncle        PROSTATE   Alcohol abuse Maternal Uncle    Drug abuse Maternal Uncle    Cancer Maternal Uncle        BRAIN   Alcohol abuse Maternal Uncle    Drug abuse Maternal Uncle    Alcohol abuse Sister    Alcohol abuse Brother    Asthma Son    Birth defects Son        EXTRA DIGITS   Seizures Son        FEBRILE   Other Neg Hx    Social History:  reports that she has never smoked. She has never used smokeless tobacco. She reports that she does not drink alcohol and does not use drugs.  Allergies: No Known Allergies  Medications Prior to Admission  Medication Sig Dispense Refill   acetaminophen (TYLENOL) 325 MG tablet Take 650 mg by mouth every 6 (six) hours as needed for mild pain or headache.     aspirin EC 81 MG tablet Take 1 tablet (81 mg total) by mouth daily. Take after 12 weeks for prevention of preeclampsia later in pregnancy 300 tablet 2   Blood Pressure Monitoring (BLOOD PRESSURE KIT) DEVI 1 kit by Does not apply route once a week. Check Blood Pressure regularly and record readings into the [employer] App.  Large Cuff.  DX O90.0 1 each 0   calcium carbonate (TUMS - DOSED IN MG ELEMENTAL CALCIUM) 500 MG chewable tablet Chew 1 tablet by  mouth daily.     enalapril (VASOTEC) 10 MG tablet Take 1 tablet (10 mg total) by mouth daily. 30 tablet 0   furosemide (LASIX) 20 MG tablet Take 1 tablet (20 mg total) by mouth daily. 5 tablet 0   ibuprofen (ADVIL) 600 MG tablet Take 1 tablet (600 mg total) by mouth every 6 (six) hours. 30 tablet 0   NIFEdipine (ADALAT CC) 60 MG 24 hr tablet Take 1 tablet (60 mg total) by mouth every 12 (twelve) hours. 60 tablet 1   pantoprazole (PROTONIX) 40 MG tablet Take 1 tablet (40 mg total) by mouth daily. 30 tablet 2   Prenatal Vit-Fe Fumarate-FA (PRENATAL MULTIVITAMIN) TABS tablet Take 1 tablet by mouth daily at 12 noon.      A comprehensive review of systems was negative.  Weight 95.7 kg, unknown if currently breastfeeding. General appearance: alert, cooperative, and appears stated age Head: Normocephalic, without obvious abnormality, atraumatic Neck: supple, symmetrical, trachea midline Lungs:  normal effort Heart: regular rate and rhythm Abdomen: soft, non-tender; bowel sounds normal; no  masses,  no organomegaly Extremities: extremities normal, atraumatic, no cyanosis or edema Skin: Skin color, texture, turgor normal. No rashes or lesions Neurologic: Grossly normal   Lab Results  Component Value Date   WBC 10.7 (H) 11/07/2021   HGB 12.0 11/07/2021   HCT 34.2 (L) 11/07/2021   MCV 83.6 11/07/2021   PLT 191 11/07/2021   Lab Results  Component Value Date   PREGTESTUR POSITIVE (A) 02/06/2017   HCG 22,225.0 06/16/2012     Assessment/Plan Principal Problem:   Unwanted fertility  For laparoscopic bilateral salpingectomy. Risks include but are not limited to bleeding, infection, injury to surrounding structures, including bowel, bladder and ureters, blood clots, and death.  Likelihood of success is high.    Donnamae Jude 12/11/2021, 11:11 AM

## 2021-12-11 NOTE — Anesthesia Preprocedure Evaluation (Signed)
Anesthesia Evaluation  Patient identified by MRN, date of birth, ID band Patient awake    Reviewed: Allergy & Precautions, NPO status , Patient's Chart, lab work & pertinent test results  Airway Mallampati: II  TM Distance: >3 FB Neck ROM: Full    Dental no notable dental hx.    Pulmonary neg pulmonary ROS,    Pulmonary exam normal breath sounds clear to auscultation       Cardiovascular hypertension, Pt. on medications Normal cardiovascular exam Rhythm:Regular Rate:Normal     Neuro/Psych negative neurological ROS  negative psych ROS   GI/Hepatic negative GI ROS, Neg liver ROS,   Endo/Other  negative endocrine ROS  Renal/GU negative Renal ROS  negative genitourinary   Musculoskeletal negative musculoskeletal ROS (+)   Abdominal   Peds negative pediatric ROS (+)  Hematology negative hematology ROS (+)   Anesthesia Other Findings   Reproductive/Obstetrics negative OB ROS                             Anesthesia Physical Anesthesia Plan  ASA: 2  Anesthesia Plan: General   Post-op Pain Management: Tylenol PO (pre-op)*   Induction: Intravenous  PONV Risk Score and Plan: 3 and Ondansetron, Dexamethasone, Midazolam and Treatment may vary due to age or medical condition  Airway Management Planned: Oral ETT  Additional Equipment:   Intra-op Plan:   Post-operative Plan: Extubation in OR  Informed Consent: I have reviewed the patients History and Physical, chart, labs and discussed the procedure including the risks, benefits and alternatives for the proposed anesthesia with the patient or authorized representative who has indicated his/her understanding and acceptance.     Dental advisory given  Plan Discussed with: CRNA and Surgeon  Anesthesia Plan Comments:         Anesthesia Quick Evaluation

## 2021-12-11 NOTE — Anesthesia Postprocedure Evaluation (Signed)
Anesthesia Post Note  Patient: [name]  Procedure(s) Performed: LAPAROSCOPIC BILATERAL SALPINGECTOMY (Bilateral: Uterus)     Patient location during evaluation: PACU Anesthesia Type: General Level of consciousness: awake Pain management: pain level controlled Vital Signs Assessment: post-procedure vital signs reviewed and stable Respiratory status: spontaneous breathing, nonlabored ventilation, respiratory function stable and patient connected to nasal cannula oxygen Cardiovascular status: blood pressure returned to baseline and stable Postop Assessment: no apparent nausea or vomiting Anesthetic complications: no   No notable events documented.  Last Vitals:  Vitals:   12/11/21 1515 12/11/21 1530  BP: (!) 145/103 (!) 150/100  Pulse: 72 72  Resp: 12 13  Temp:  36.7 C  SpO2: 95% 96%    Last Pain:  Vitals:   12/11/21 1500  TempSrc:   PainSc: Asleep                 Aza Dantes P Carole Deere

## 2021-12-11 NOTE — Anesthesia Procedure Notes (Signed)
Procedure Name: Intubation Date/Time: 12/11/2021 1:45 PM  Performed by: Everardo Pacific, RNPre-anesthesia Checklist: Patient identified, Emergency Drugs available, Suction available and Patient being monitored Patient Re-evaluated:Patient Re-evaluated prior to induction Oxygen Delivery Method: Circle system utilized Preoxygenation: Pre-oxygenation with 100% oxygen Induction Type: IV induction Ventilation: Mask ventilation without difficulty Laryngoscope Size: Mac and 3 Grade View: Grade II Tube type: Oral Tube size: 7.0 mm Number of attempts: 1 Airway Equipment and Method: Stylet and Oral airway Placement Confirmation: ETT inserted through vocal cords under direct vision, positive ETCO2 and breath sounds checked- equal and bilateral Secured at: 21 cm Tube secured with: Tape Dental Injury: Teeth and Oropharynx as per pre-operative assessment

## 2021-12-12 ENCOUNTER — Encounter (HOSPITAL_COMMUNITY): Payer: Self-pay | Admitting: Family Medicine

## 2021-12-13 LAB — SURGICAL PATHOLOGY

## 2021-12-19 ENCOUNTER — Encounter: Payer: Medicaid Other | Admitting: Licensed Clinical Social Worker

## 2021-12-19 ENCOUNTER — Institutional Professional Consult (permissible substitution): Payer: Medicaid Other | Admitting: Licensed Clinical Social Worker

## 2021-12-19 ENCOUNTER — Telehealth: Payer: Medicaid Other | Admitting: Obstetrics & Gynecology

## 2021-12-19 NOTE — Progress Notes (Signed)
Video-visit cancelled.

## 2022-01-08 ENCOUNTER — Telehealth: Payer: Self-pay

## 2022-01-08 NOTE — Telephone Encounter (Signed)
Ronnell Freshwater from [facility] left VM on nurse line stating she has been unable to find correct sterilization consent. Pt had BTL on 12/11/21. Requests correct form be emailed to [email or web address]. Callback number is [phone]. Routed to correct office.

## 2022-06-30 IMAGING — US US MFM OB DETAIL+14 WK
1 series · 12 of 28 positions shown · non-contrast
Comparison: none

[Series 1: us mfm ob detail+14 wk · 12 of 171 slices shown]
[im 7/171]
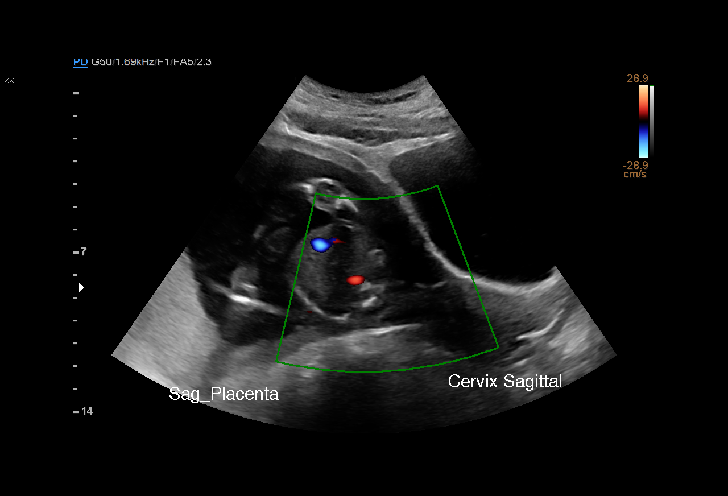
[im 19/171]
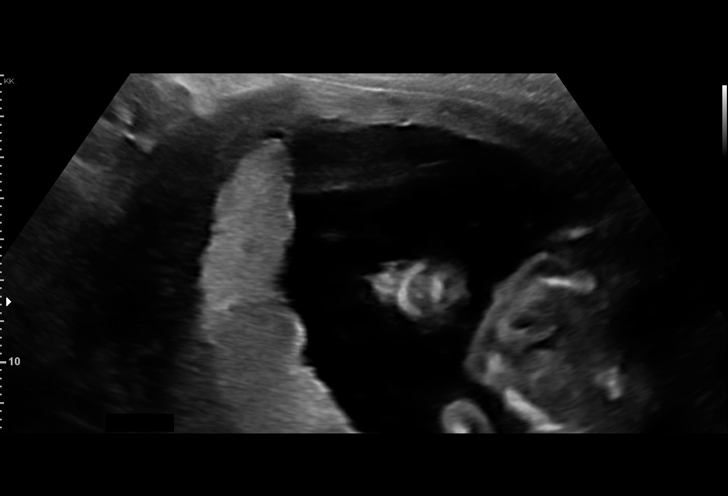
[im 32/171]
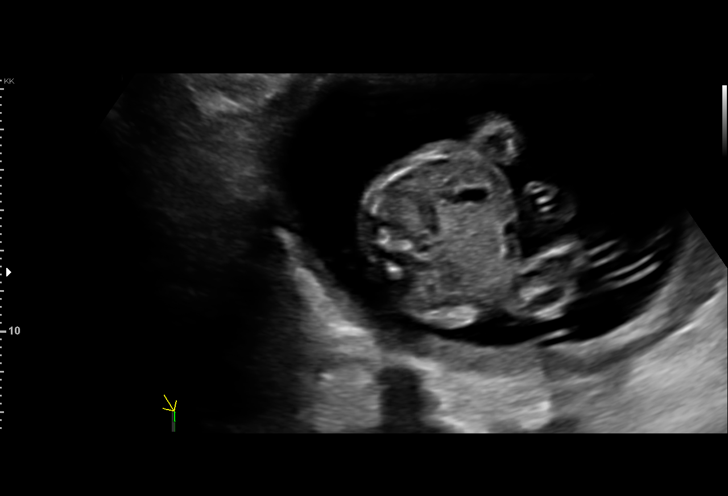
[im 51/171]
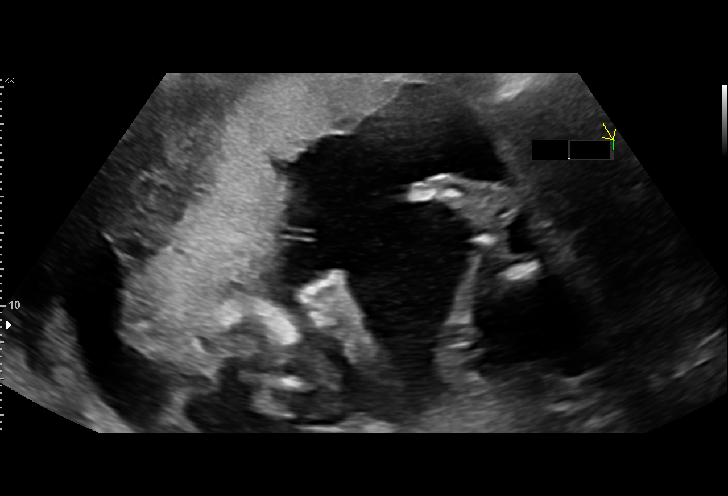
[im 63/171]
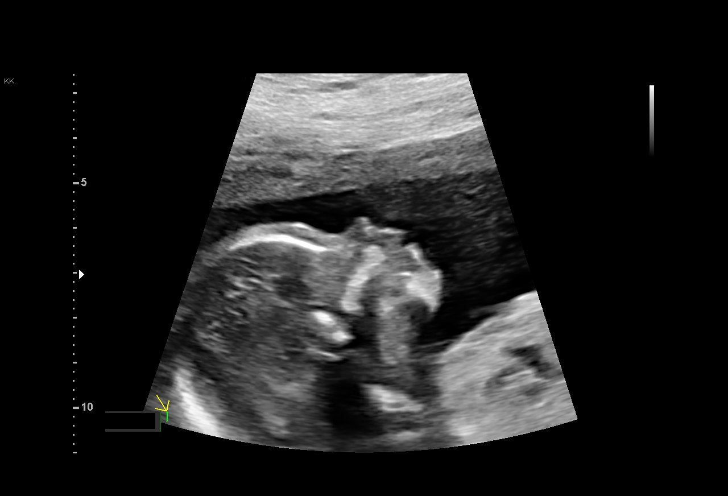
[im 76/171]
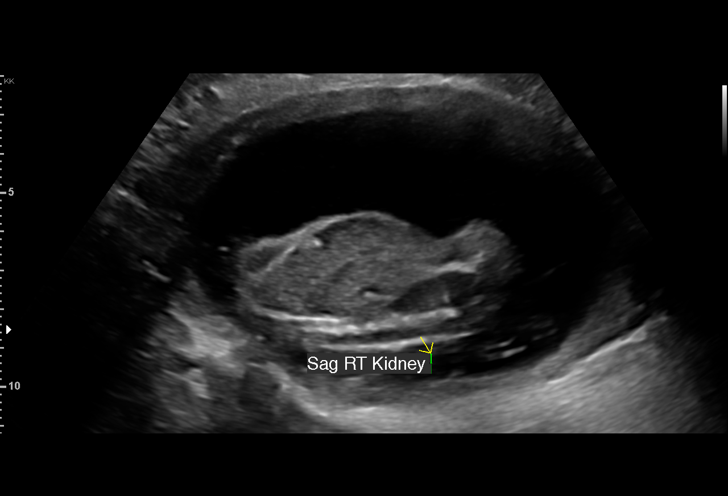
[im 95/171]
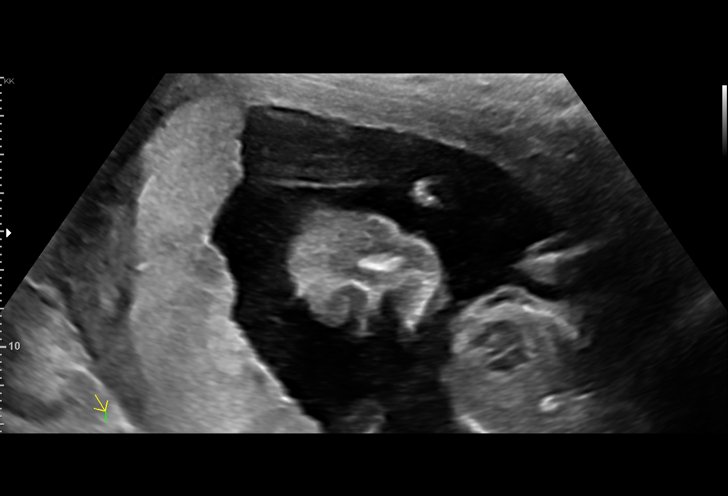
[im 108/171]
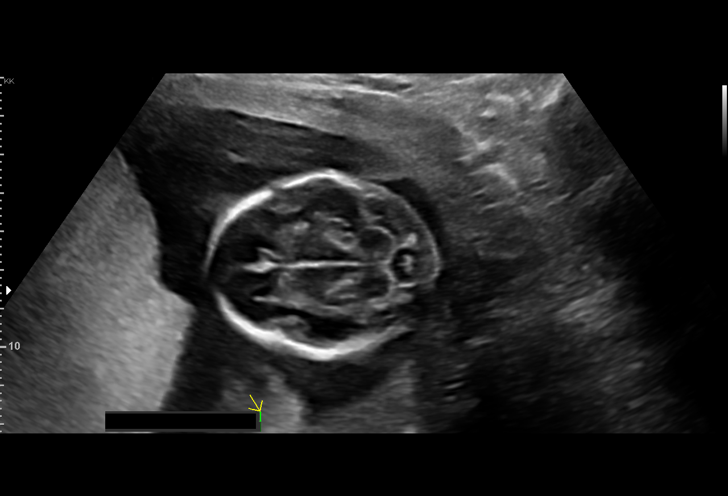
[im 120/171]
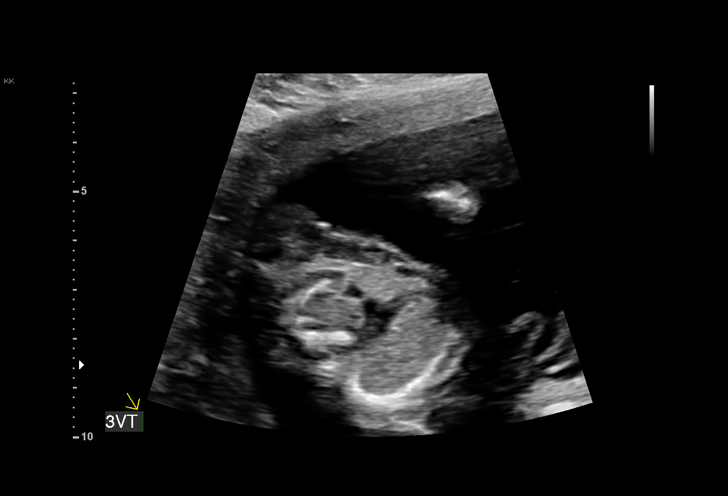
[im 139/171]
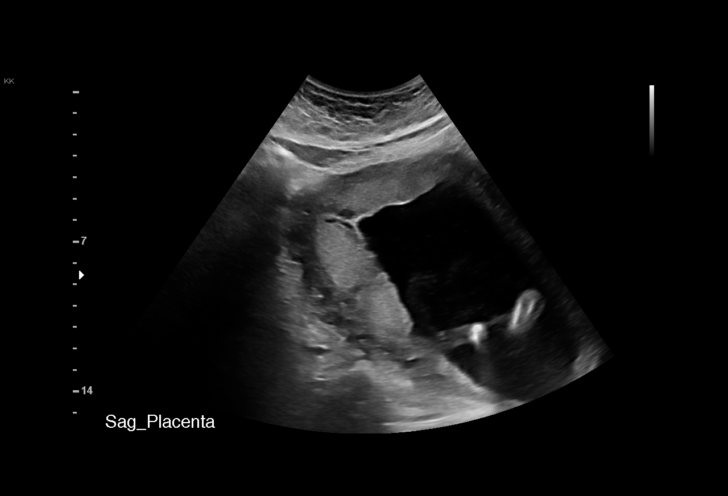
[im 152/171]
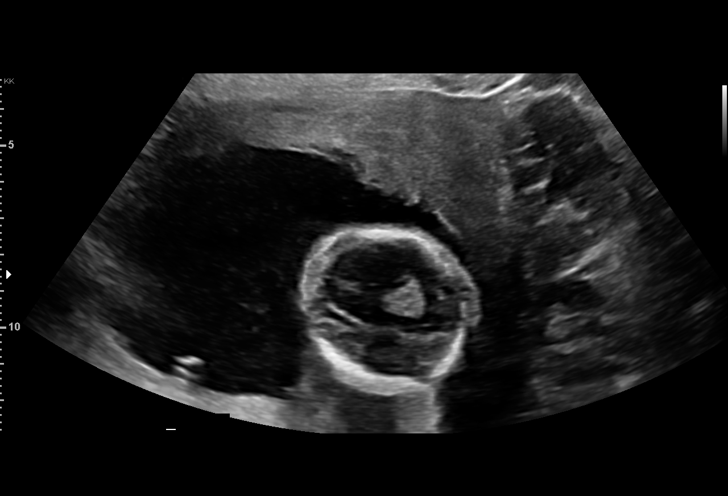
[im 164/171]
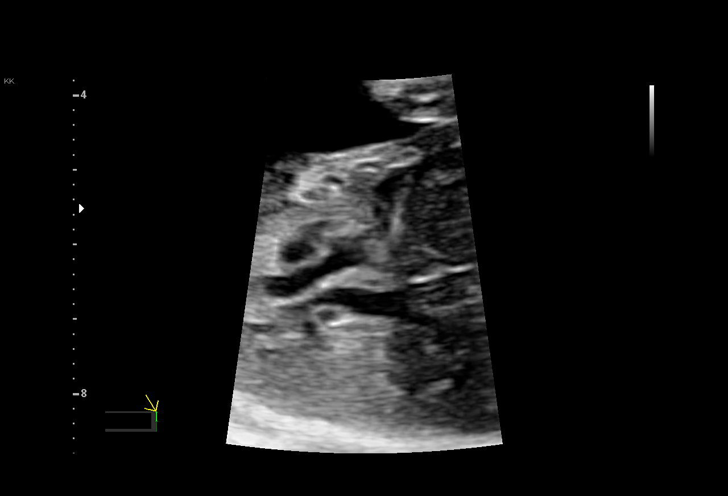

[12 of 28 positions shown; findings below may reference images not displayed]

[REDACTED]
                   [id number]

Indications

 Echogenic intracardiac focus of the heart
 (EIF)
 Genetic carrier (Increased risk SMA)
 Cystic Fibrosis (CF) Carrier, second trimester
 19 weeks gestation of pregnancy
 Encounter for antenatal screening for
 malformations
 Poor obstetric history: Previous
 preeclampsia / eclampsia/gestational HTN
 Poor obstetric history: Previous preterm
 delivery, antepartum
 Advanced maternal age multigravida 35+,
 second trimester (36 yrs at delivery)
 Poor obstetric history: Previous gestational
 diabetes
 Obesity complicating pregnancy, second
 trimester (BMI 38)
 LR NIPS/ Negative AFP
Fetal Evaluation

 Num Of Fetuses:         1
 Fetal Heart Rate(bpm):  153
 Cardiac Activity:       Observed
 Presentation:           Transverse, head to maternal right
 Placenta:               Posterior Fundal
 P. Cord Insertion:      Visualized

 Amniotic Fluid
 AFI FV:      Within normal limits

                             Largest Pocket(cm)

Biometry

 BPD:      45.7  mm     G. Age:  19w 6d         67  %    CI:        75.09   %    70 - 86
                                                         FL/HC:      17.0   %    16.1 -
 HC:      167.3  mm     G. Age:  19w 3d         41  %    HC/AC:      1.17        1.09 -
 AC:      143.1  mm     G. Age:  19w 5d         53  %    FL/BPD:     62.1   %
 FL:       28.4  mm     G. Age:  18w 5d         19  %    FL/AC:      19.8   %    20 - 24
 HUM:      27.7  mm     G. Age:  18w 6d         37  %
 CER:      20.2  mm     G. Age:  19w 3d         56  %
 NFT:       5.0  mm
 LV:        5.9  mm
 CM:        3.1  mm

 Est. FW:     282  gm    0 lb 10 oz      35  %
OB History

 Blood Type:   A+
 Gravidity:    6         Term:   2        Prem:   3
Gestational Age

 LMP:           26w 3d        Date:  01/20/21                 EDD:   10/27/21
 U/S Today:     19w 3d                                        EDD:   12/15/21
 Best:          19w 3d     Det. By:  Previous Ultrasound      EDD:   12/15/21
                                     (06/07/21)
Anatomy

 Cranium:               Appears normal         LVOT:                   Not well visualized
 Cavum:                 Appears normal         Aortic Arch:            Not well visualized
 Ventricles:            Appears normal         Ductal Arch:            Appears normal
 Choroid Plexus:        Appears normal         Diaphragm:              Appears normal
 Cerebellum:            Appears normal         Stomach:                Appears normal, left
                                                                       sided
 Posterior Fossa:       Appears normal         Abdomen:                Appears normal
 Nuchal Fold:           Appears normal         Abdominal Wall:         Appears nml (cord
                                                                       insert, abd wall)
 Face:                  Absent nasal           Cord Vessels:           Appears normal (3
                        bone; Orbits
                        normal
                                                                       vessel cord)

 Lips:                  Appears normal         Kidneys:                Appear normal
 Palate:                Not well visualized    Bladder:                Appears normal
 Thoracic:              Appears normal         Spine:                  Not well visualized
 Heart:                 Not well               Upper Extremities:      Appears normal
                        visualized,EIF
 RVOT:                  Not well visualized    Lower Extremities:      Appears normal

 Other:  Fetus appears to be a male. Heels/feet and open hands/5th digits,
         VC, 3VV and 3VTV visualized. Technically difficult due to fetal
         position.
Cervix Uterus Adnexa

 Cervix
 Length:           3.96  cm.
 Normal appearance by transabdominal scan.

 Adnexa
 No abnormality visualized.
Impression

 G6 O9HE5.  Patient is here for fetal anatomy scan.
 On cell-free fetal DNA screening, the risks of fetal
 aneuploidies are not increased .MSAFP screening showed
 low risk for open-neural tube defects .
 Obstetric history significant for 2 term vaginal deliveries and 3
 preterm vaginal deliveries.
 Patient is a carrier of cystic fibrosis mutation and has
 increased carrier risk for spinal muscular atrophy.  She met
 with our genetic counselors and later opted not to have
 partner screening.

 We performed a fetal anatomical survey.  An echogenic
 intracardiac focus is seen.  Nasal bone is absent.  No other
 markers of aneuploidy's or fetal structural defects are seen.
 Fetal biometry is consistent with the previously established
 dates.  Amniotic fluid is normal and good fetal activity seen.

 I discussed the findings.  Echogenic intracardiac focus and
 absent nasal bone or markers for Down syndrome.  Given
 that she had low risk for Down syndrome on cell free fetal
 DNA screening, these are considered normal variants.  I did
 not recommend amniocentesis for confirmation of Down
 syndrome.  However, I informed her that advanced maternal
 age is associated with increased risk of chromosomal
 anomalies.  Amniocentesis affords an opportunity to
 determine if the fetus is affected with cystic fibrosis and/or
 spinal muscular atrophy.
 I explained the procedure and possible complication of
 miscarriage (1 and 500 procedures).  Patient opted not to
 have amniocentesis.
Recommendations

 -An appointment was made for her to return in 4 weeks for
 completion of fetal anatomy.
                 [place], Lashawnda

## 2022-10-01 IMAGING — US US MFM OB FOLLOW-UP
1 series · 13 of 28 positions shown · non-contrast
Comparison: none

[Series 1: us mfm ob follow-up · 13 of 62 slices shown]
[im 3/62]
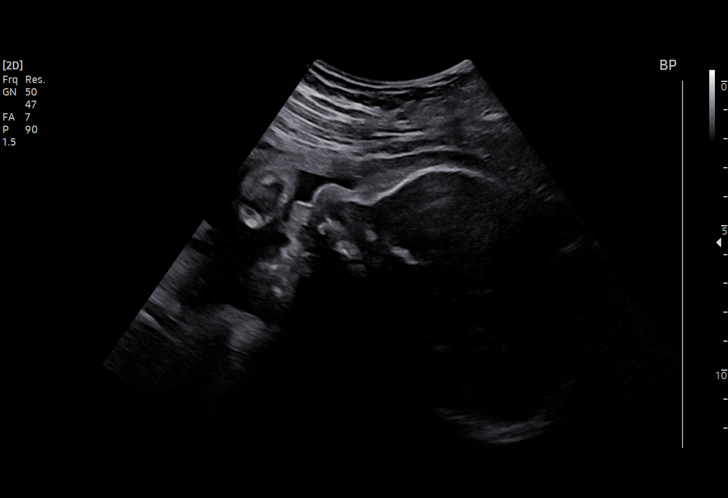
[im 7/62]
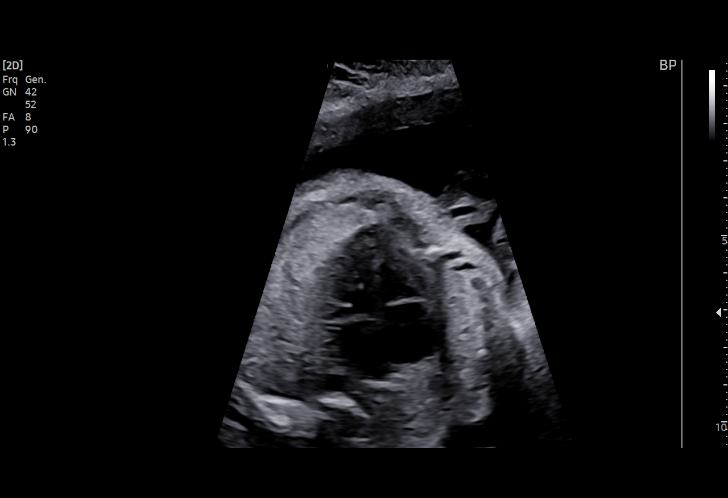
[im 12/62]
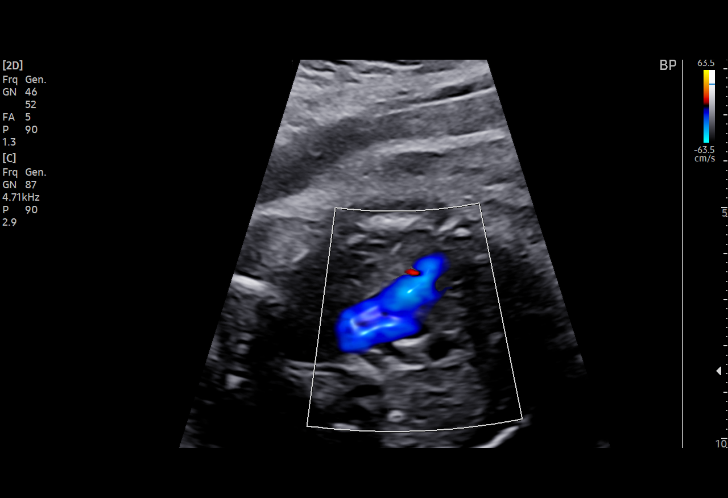
[im 16/62]
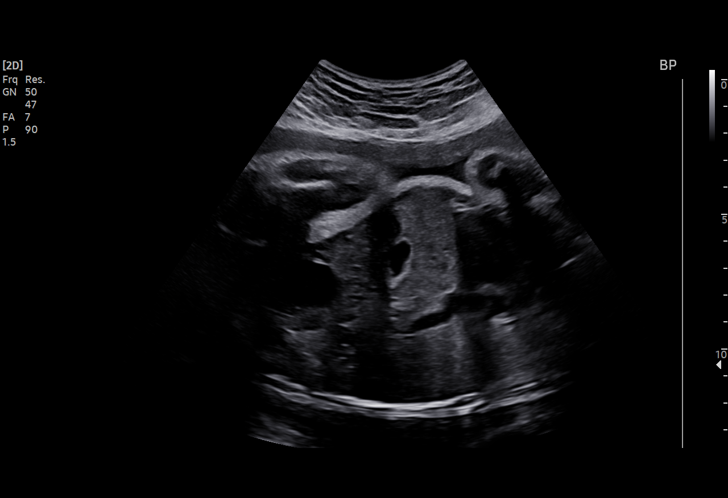
[im 21/62]
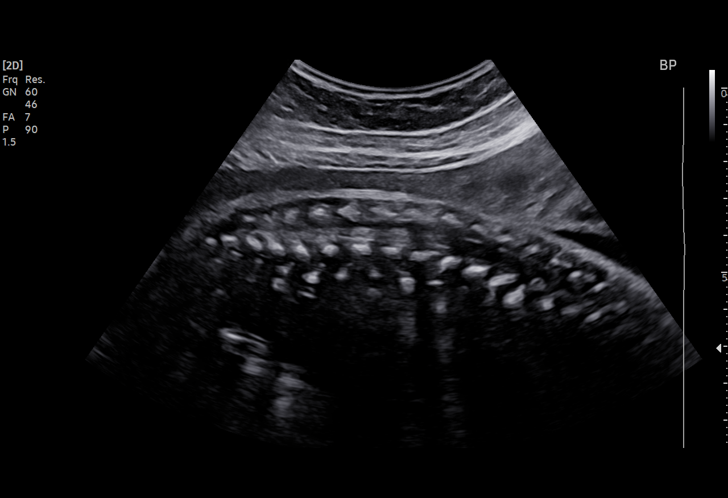
[im 25/62]
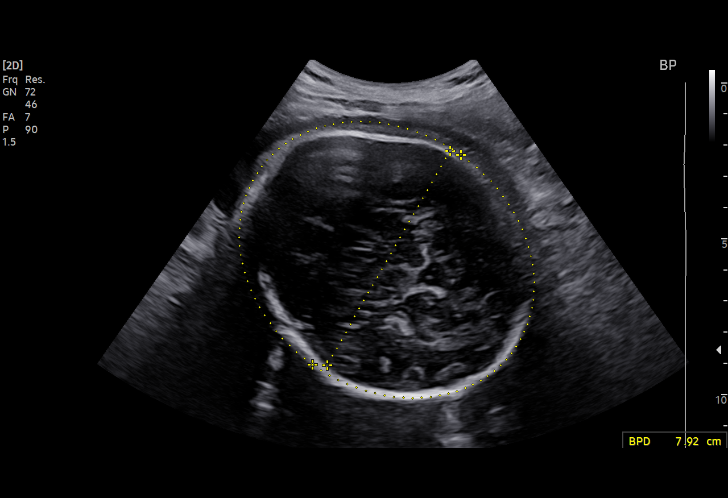
[im 32/62]
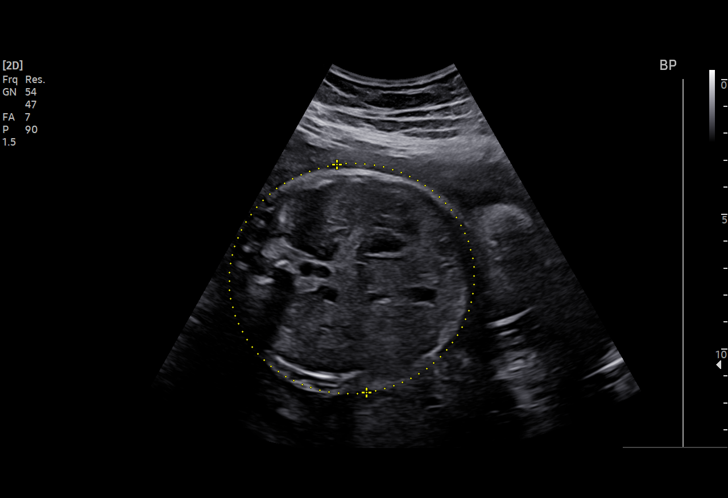
[im 37/62]
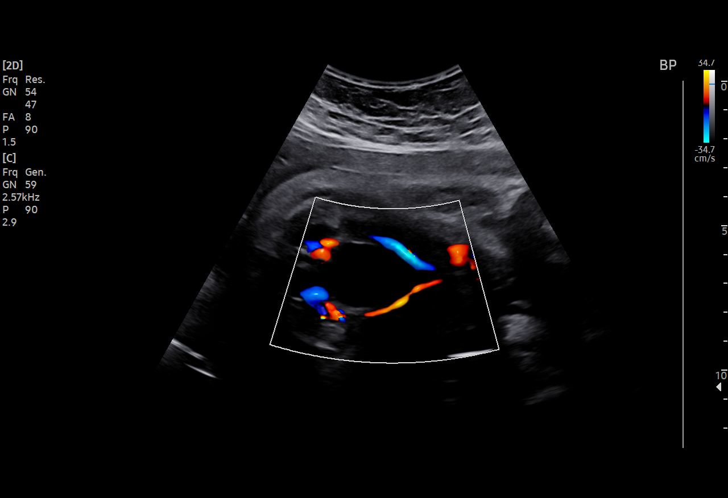
[im 41/62]
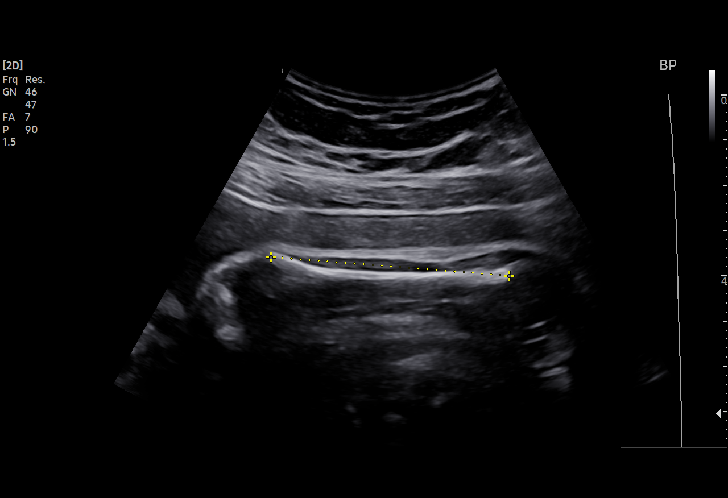
[im 46/62]
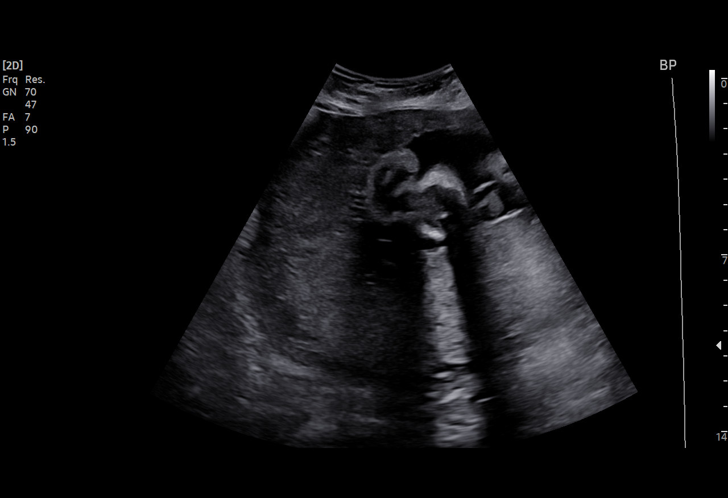
[im 50/62]
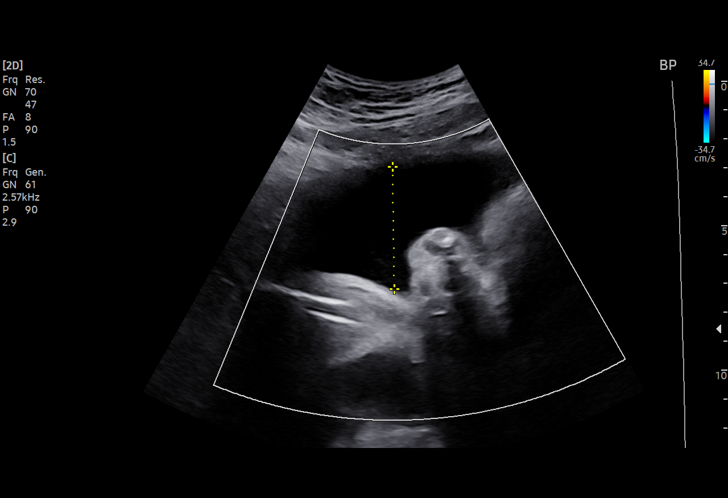
[im 55/62]
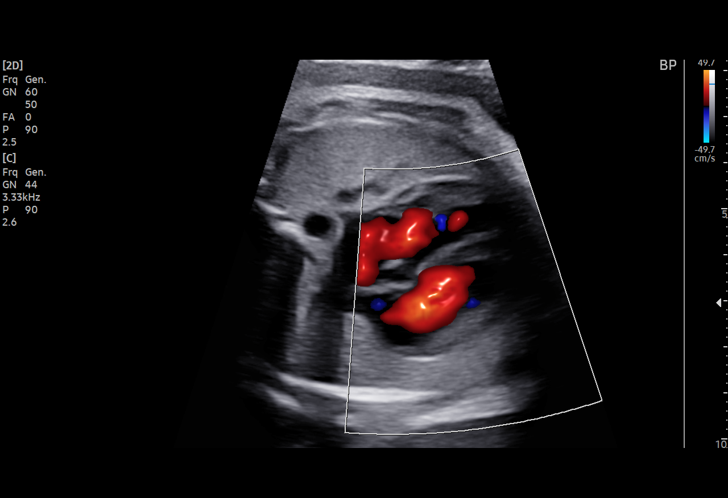
[im 59/62]
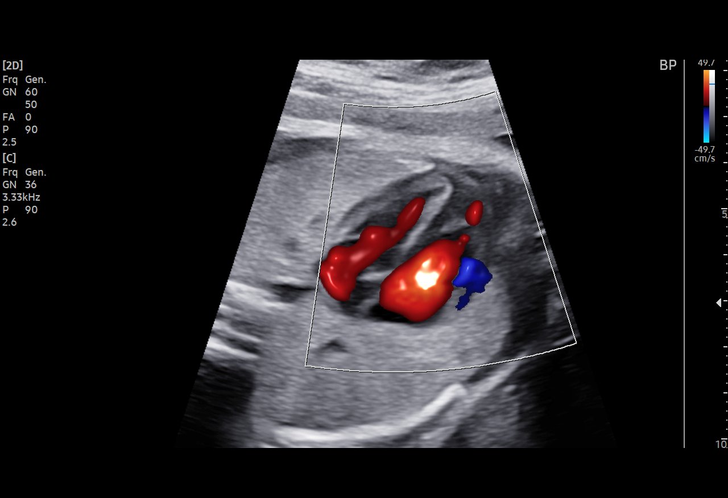

[13 of 28 positions shown; findings below may reference images not displayed]

[REDACTED]
                   [id number]

Indications

 Advanced maternal age multigravida 35+,
 third trimester (36 y.o @ delivery)
 Obesity complicating pregnancy, third
 trimester (BMI 38)
 Echogenic intracardiac focus of the heart
 (EIF)
 Genetic carrier (Increased risk SMA)
 Cystic Fibrosis (CF) Carrier, third trimester
 Poor obstetric history: Previous
 preeclampsia / eclampsia/gestational HTN
 Poor obstetric history: Previous preterm
 delivery, antepartum
 Poor obstetric history: Previous gestational
 diabetes
 LR NIPS/ Negative AFP
 32 weeks gestation of pregnancy
Fetal Evaluation

 Num Of Fetuses:         1
 Fetal Heart Rate(bpm):  140
 Cardiac Activity:       Observed
 Presentation:           Cephalic
 Placenta:               Posterior Fundal
 P. Cord Insertion:      Previously Visualized
 Amniotic Fluid
 AFI FV:      Within normal limits

 AFI Sum(cm)     %Tile       Largest Pocket(cm)
 9.62            14

 RUQ(cm)       RLQ(cm)       LUQ(cm)        LLQ(cm)
 0
Biometry

 BPD:      79.8  mm     G. Age:  32w 0d         23  %    CI:        77.24   %    70 - 86
                                                         FL/HC:      20.8   %    19.9 -
 HC:      287.5  mm     G. Age:  31w 4d          3  %    HC/AC:      1.04        0.96 -
 AC:      277.1  mm     G. Age:  31w 5d         23  %    FL/BPD:     74.9   %    71 - 87
 FL:       59.8  mm     G. Age:  31w 1d          7  %    FL/AC:      21.6   %    20 - 24
 HUM:      52.9  mm     G. Age:  30w 6d         18  %

 Est. FW:    1315  gm    3 lb 15 oz      13  %
OB History

 Blood Type:   A+
 Gravidity:    6         Term:   2        Prem:   3
Gestational Age

 LMP:           39w 5d        Date:  01/20/21                 EDD:   10/27/21
 U/S Today:     31w 4d                                        EDD:   12/23/21
 Best:          32w 5d     Det. By:  Previous Ultrasound      EDD:   12/15/21
                                     (06/07/21)
Anatomy

 Cranium:               Appears normal         Aortic Arch:            Previously seen
 Cavum:                 Appears normal         Ductal Arch:            Previously seen
 Ventricles:            Previously seen        Diaphragm:              Previously seen
 Choroid Plexus:        Previously seen        Stomach:                Appears normal, left
                                                                       sided
 Cerebellum:            Previously seen        Abdomen:                Previously seen
 Posterior Fossa:       Previously seen        Abdominal Wall:         Previously seen
 Nuchal Fold:           Previously seen        Cord Vessels:           Appears normal (3
                                                                       vessel cord)
 Face:                  Absent nasal bone      Kidneys:                Appear normal
                        prev seen
 Lips:                  Previously seen        Bladder:                Appears normal
 Thoracic:              Previously seen        Spine:                  Not well visualized
 Heart:                 Appears normal; EIF    Upper Extremities:      Previously seen
 RVOT:                  Appears normal         Lower Extremities:      Previously seen
 LVOT:                  Appears normal

 Other:  Male gender previously seen. Heels/feet and open hands/5th digits,
         VC, 3VV and 3VTV previously visualized. Technically difficult due to
         fetal position.
Comments

 This patient was seen for a follow up growth scan due to
 maternal obesity and history of chronic hypertension.  She
 denies any problems since her last exam.
 She was informed that the fetal growth and amniotic fluid
 level appears appropriate for her gestational age.
 Due to maternal obesity, a follow-up growth scan was
 scheduled in 4 weeks.

## 2024-02-19 ENCOUNTER — Encounter (HOSPITAL_COMMUNITY): Payer: Self-pay | Admitting: Emergency Medicine

## 2024-02-19 ENCOUNTER — Ambulatory Visit (HOSPITAL_COMMUNITY)
Admission: EM | Admit: 2024-02-19 | Discharge: 2024-02-19 | Disposition: A | Discharge status: Home / Self Care | Attending: Emergency Medicine | Admitting: Emergency Medicine

## 2024-02-19 DIAGNOSIS — B9689 Other specified bacterial agents as the cause of diseases classified elsewhere: Secondary | ICD-10-CM

## 2024-02-19 DIAGNOSIS — J019 Acute sinusitis, unspecified: Secondary | ICD-10-CM | POA: Diagnosis not present

## 2024-02-19 MED ORDER — PREDNISONE 20 MG PO TABS: 40.0000 mg | ORAL_TABLET | Freq: Every day | ORAL | 0 refills | Status: AC

## 2024-02-19 MED ORDER — AMOXICILLIN-POT CLAVULANATE 875-125 MG PO TABS: 1.0000 | ORAL_TABLET | Freq: Two times a day (BID) | ORAL | 0 refills | Status: AC

## 2024-02-19 NOTE — ED Triage Notes (Signed)
 Pt reports for a week having congestion and sinus headaches. Taking OTC meds for symptoms. Reports hx sinus infection and amoxicillin  helped before.

## 2024-02-19 NOTE — ED Provider Notes (Signed)
 [facility]    CSN: [id number] Arrival date & time: 02/19/24  0859      History   Chief Complaint Chief Complaint  Patient presents with   Facial Pain   Nasal Congestion    HPI Paula Livingston is a 38 y.o. female.   Patient presents with nasal congestion, headache, and sinus pressure for about a week.  Patient states that she has been taking Sudafed and using nasal spray with minimal relief.  Patient states that she has a history of recurrent sinus infections.  Patient denies fever, body aches, chills, nausea, vomiting, diarrhea, chest pain, and shortness of breath,  The history is provided by the patient and medical records.    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    Past Surgical History:  Procedure Laterality Date   CESAREAN SECTION     LAPAROSCOPIC BILATERAL SALPINGECTOMY Bilateral 12/11/2021   Procedure: LAPAROSCOPIC BILATERAL SALPINGECTOMY;  Surgeon: Fredirick Glenys RAMAN, MD;  Location: [facility] OR;  Service: Gynecology;  Laterality: Bilateral;    OB History     Gravida  6   Para  6   Term  2   Preterm  4   AB  0   Living  6      SAB  0   IAB  0   Ectopic  0   Multiple  0   Live Births  6        Obstetric Comments  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 Authorizing Provider  amoxicillin -clavulanate (AUGMENTIN ) 875-125 MG tablet Take 1 tablet by mouth every 12 (twelve) hours. 02/19/24  Yes Johnie, Syerra Abdelrahman A, NP  predniSONE  (DELTASONE ) 20 MG tablet Take 2 tablets (40 mg total) by mouth daily for 3 days. 02/19/24 02/22/24 Yes Johnie,  Trinidee Schrag A, NP  acetaminophen  (TYLENOL ) 325 MG tablet Take 650 mg by mouth every 6 (six) hours as needed for mild pain or headache.    [provider]  aspirin  EC 81 MG tablet Take 1 tablet (81 mg total) by mouth daily. Take after 12 weeks for prevention of preeclampsia later in pregnancy 07/10/21   Eveline Lynwood MATSU, MD  Blood Pressure Monitoring (BLOOD PRESSURE KIT) DEVI 1 kit by Does not apply route once a week. Check Blood Pressure regularly and record readings into the [employer] App.  Large Cuff.  DX O90.0 08/07/21   Eveline Lynwood MATSU, MD  calcium carbonate (TUMS - DOSED IN MG ELEMENTAL CALCIUM) 500 MG chewable tablet Chew 1 tablet by mouth daily.    [provider]  enalapril  (VASOTEC ) 10 MG tablet Take 1 tablet (10 mg total) by mouth daily. 11/09/21   Fredirick Glenys RAMAN, MD  furosemide  (LASIX ) 20 MG tablet Take 1 tablet (20 mg total) by mouth daily. 11/09/21   Fredirick Glenys RAMAN, MD  ibuprofen  (ADVIL ) 600 MG tablet Take 1 tablet (600 mg total) by mouth every 6 (six) hours. 11/09/21   Fredirick Glenys RAMAN, MD  NIFEdipine  (ADALAT  CC) 60 MG 24 hr tablet Take 1 tablet (60 mg total) by  mouth every 12 (twelve) hours. 11/09/21   Fredirick Glenys RAMAN, MD  oxyCODONE  (OXY IR/ROXICODONE ) 5 MG immediate release tablet Take 1 tablet (5 mg total) by mouth every 6 (six) hours as needed for severe pain. 12/11/21   Fredirick Glenys RAMAN, MD  pantoprazole  (PROTONIX ) 40 MG tablet Take 1 tablet (40 mg total) by mouth daily. 09/25/21   Constant, Peggy, MD  Prenatal Vit-Fe Fumarate-FA (PRENATAL MULTIVITAMIN) TABS tablet Take 1 tablet by mouth daily at 12 noon.    [provider]    Family History Family History  Problem Relation Age of Onset   Alcohol abuse Mother    Drug abuse Mother    Aneurysm Father        BRAIN   Cancer Maternal Aunt 18       BREAST   Cancer Maternal Uncle        PROSTATE   Diabetes Maternal Uncle    Alcohol abuse Maternal Uncle    Drug abuse Maternal Uncle    Cancer Maternal Uncle        PROSTATE    Alcohol abuse Maternal Uncle    Drug abuse Maternal Uncle    Cancer Maternal Uncle        BRAIN   Alcohol abuse Maternal Uncle    Drug abuse Maternal Uncle    Alcohol abuse Sister    Alcohol abuse Brother    Asthma Son    Birth defects Son        EXTRA DIGITS   Seizures Son        FEBRILE   Other Neg Hx     Social History Social History   Tobacco Use   Smoking status: Never   Smokeless tobacco: Never  Vaping Use   Vaping status: Never Used  Substance Use Topics   Alcohol use: No   Drug use: No     Allergies   Patient has no known allergies.   Review of Systems Review of Systems  Per HPI  Physical Exam Triage Vital Signs ED Triage Vitals  Encounter Vitals Group     BP 02/19/24 0923 138/87     Girls Systolic BP Percentile --      Girls Diastolic BP Percentile --      Boys Systolic BP Percentile --      Boys Diastolic BP Percentile --      Pulse Rate 02/19/24 0923 93     Resp 02/19/24 0923 18     Temp 02/19/24 0923 99 F (37.2 C)     Temp Source 02/19/24 0923 Oral     SpO2 02/19/24 0923 98 %     Weight --      Height --      Head Circumference --      Peak Flow --      Pain Score 02/19/24 0921 7     Pain Loc --      Pain Education --      Exclude from Growth Chart --    No data found.  Updated Vital Signs BP 138/87 (BP Location: Left Arm)   Pulse 93   Temp 99 F (37.2 C) (Oral)   Resp 18   LMP 01/29/2024 (Exact Date)   SpO2 98%   Breastfeeding No   Visual Acuity Right Eye Distance:   Left Eye Distance:   Bilateral Distance:    Right Eye Near:   Left Eye Near:    Bilateral Near:     Physical Exam Vitals and  nursing note reviewed.  Constitutional:      General: She is awake. She is not in acute distress.    Appearance: Normal appearance. She is well-developed and well-groomed. She is not ill-appearing.  HENT:     Right Ear: Tympanic membrane, ear canal and external ear normal.     Left Ear: Tympanic membrane, ear canal and  external ear normal.     Nose: Congestion and rhinorrhea present.     Right Sinus: Maxillary sinus tenderness present.     Left Sinus: Maxillary sinus tenderness present.     Mouth/Throat:     Mouth: Mucous membranes are moist.     Pharynx: Posterior oropharyngeal erythema present. No oropharyngeal exudate.  Cardiovascular:     Rate and Rhythm: Normal rate and regular rhythm.  Pulmonary:     Effort: Pulmonary effort is normal.     Breath sounds: Normal breath sounds.  Skin:    General: Skin is warm and dry.  Neurological:     Mental Status: She is alert.  Psychiatric:        Behavior: Behavior is cooperative.      UC Treatments / Results  Labs (all labs ordered are listed, but only abnormal results are displayed) Labs Reviewed - No data to display  EKG   Radiology No results found.  Procedures Procedures (including critical care time)  Medications Ordered in UC Medications - No data to display  Initial Impression / Assessment and Plan / UC Course  I have reviewed the triage vital signs and the nursing notes.  Pertinent labs & imaging results that were available during my care of the patient were reviewed by me and considered in my medical decision making (see chart for details).     Patient is overall well-appearing.  Vitals are stable.  Exam findings consistent with bacterial sinusitis.  Prescribed Augmentin  and a short prednisone  burst for this.  Discussed over-the-counter medications as needed for symptoms.  Given ENT follow-up regarding recurrent sinus infections.  Discussed follow-up and return precautions. Final Clinical Impressions(s) / UC Diagnoses   Final diagnoses:  Acute bacterial sinusitis     Discharge Instructions      Starting Augmentin  twice daily for 7 days for sinus infection. Also take 2 tablets of prednisone  once daily for 3 days for relief of sinus pressure and inflammation You can continue taking Sudafed to help with congestion. You  can also use over-the-counter saline nasal spray to assist with congestion as well. I have attached Dr. Karis who is an ear nose and throat doctor that you can follow-up with for concerns of a recurrent sinus infections. Otherwise follow-up with your primary care provider or return here as needed.   ED Prescriptions     Medication Sig Dispense Auth. Provider   amoxicillin -clavulanate (AUGMENTIN ) 875-125 MG tablet Take 1 tablet by mouth every 12 (twelve) hours. 14 tablet Johnie, Dereck Agerton A, NP   predniSONE  (DELTASONE ) 20 MG tablet Take 2 tablets (40 mg total) by mouth daily for 3 days. 6 tablet Johnie Flaming A, NP      PDMP not reviewed this encounter.   Johnie Flaming A, NP 02/19/24 [phone]

## 2024-02-19 NOTE — Discharge Instructions (Signed)
 Starting Augmentin  twice daily for 7 days for sinus infection. Also take 2 tablets of prednisone  once daily for 3 days for relief of sinus pressure and inflammation You can continue taking Sudafed to help with congestion. You can also use over-the-counter saline nasal spray to assist with congestion as well. I have attached Dr. Karis who is an ear nose and throat doctor that you can follow-up with for concerns of a recurrent sinus infections. Otherwise follow-up with your primary care provider or return here as needed.
